# Patient Record
Sex: Female | Born: 1965
Health system: Southern US, Community
[De-identification: ages and names within clinical notes are randomized; demographics above are authoritative.]

## PROBLEM LIST (undated history)

## (undated) DIAGNOSIS — D72819 Decreased white blood cell count, unspecified: Secondary | ICD-10-CM

## (undated) DIAGNOSIS — L821 Other seborrheic keratosis: Secondary | ICD-10-CM

## (undated) DIAGNOSIS — G43909 Migraine, unspecified, not intractable, without status migrainosus: Secondary | ICD-10-CM

## (undated) DIAGNOSIS — U071 COVID-19: Secondary | ICD-10-CM

## (undated) DIAGNOSIS — E538 Deficiency of other specified B group vitamins: Secondary | ICD-10-CM

## (undated) HISTORY — DX: Migraine, unspecified, not intractable, without status migrainosus: G43.909

## (undated) HISTORY — DX: Deficiency of other specified B group vitamins: E53.8

## (undated) HISTORY — PX: NO PAST SURGERIES: SHX2092

## (undated) HISTORY — DX: Other seborrheic keratosis: L82.1

## (undated) HISTORY — DX: COVID-19: U07.1

## (undated) HISTORY — DX: Decreased white blood cell count, unspecified: D72.819

---

## 2012-05-23 HISTORY — PX: BREAST BIOPSY: SHX20

## 2017-02-27 ENCOUNTER — Encounter: Payer: Self-pay | Admitting: Medical

## 2017-02-27 ENCOUNTER — Ambulatory Visit: Payer: Self-pay | Admitting: Medical

## 2017-02-27 VITALS — BP 100/78 | HR 56 | Temp 97.7°F | Resp 18 | Ht 65.0 in | Wt 132.0 lb

## 2017-02-27 DIAGNOSIS — R21 Rash and other nonspecific skin eruption: Secondary | ICD-10-CM

## 2017-02-27 DIAGNOSIS — G43909 Migraine, unspecified, not intractable, without status migrainosus: Secondary | ICD-10-CM | POA: Insufficient documentation

## 2017-02-27 NOTE — Progress Notes (Signed)
   Subjective:    Patient ID: Veronica Henderson, female    DOB: 19-Dec-1965, 51 y.o.   MRN: [id number]  HPI 51 yo female with rash on lower legs x 1 week, itchy. Did mow the laaw one week ago.   Review of Systems  Constitutional: Negative for chills and fever.  HENT: Negative for congestion, ear pain and sore throat.   Eyes: Negative for discharge and itching.  Respiratory: Negative for cough and shortness of breath.   Cardiovascular: Negative for chest pain and leg swelling.  Gastrointestinal: Negative for abdominal pain.  Endocrine: Negative for polydipsia, polyphagia and polyuria.  Genitourinary: Negative for dysuria.  Musculoskeletal: Negative for myalgias.  Skin: Positive for rash.  Allergic/Immunologic: Negative for environmental allergies and food allergies.  Neurological: Negative for dizziness, syncope and light-headedness.  Hematological: Negative for adenopathy.  Psychiatric/Behavioral: Negative for self-injury and suicidal ideas.       Objective:   Physical Exam  Constitutional: She is oriented to person, place, and time. She appears well-developed and well-nourished.  HENT:  Head: Normocephalic and atraumatic.  Eyes: Pupils are equal, round, and reactive to light. Conjunctivae and EOM are normal.  Neurological: She is alert and oriented to person, place, and time.  Skin: Skin is warm and dry. Rash noted.  Psychiatric: She has a normal mood and affect. Her behavior is normal. Judgment and thought content normal.  Nursing note and vitals reviewed.   Few maculopapular rash on lower legs, anterior and posterior of legs, some excoriated. No discharge or drainage from rash. Patient says some turn to pustules none noted today.      Assessment & Plan:  Rash ? Contact dermatitis Will try conservative treatment with OTC hydrocortisone cream 1% twice daily to affected area x  7-10 days. OTC Zyrtec take as directed for itching. Return in  7-10 days unless worsening , return  sooner. Patient verbalizes understanding and has no questions at discharge.

## 2017-02-27 NOTE — Patient Instructions (Signed)
OTC hydrocortisone cream 1% twice day for up to  10 days. OTC Zyrtec for itching take one daily. Return in  7-10 sooner if worsening.   Rash A rash is a change in the color of the skin. A rash can also change the way your skin feels. There are many different conditions and factors that can cause a rash. Follow these instructions at home: Pay attention to any changes in your symptoms. Follow these instructions to help with your condition: Medicine Take or apply over-the-counter and prescription medicines only as told by your health care provider. These may include:  Corticosteroid cream.  Anti-itch lotions.  Oral antihistamines.  Skin Care  Apply cool compresses to the affected areas.  Try taking a bath with: ? Epsom salts. Follow the instructions on the packaging. You can get these at your local pharmacy or grocery store. ? Baking soda. Pour a small amount into the bath as told by your health care provider. ? Colloidal oatmeal. Follow the instructions on the packaging. You can get this at your local pharmacy or grocery store.  Try applying baking soda paste to your skin. Stir water into baking soda until it reaches a paste-like consistency.  Do not scratch or rub your skin.  Avoid covering the rash. Make sure the rash is exposed to air as much as possible. General instructions  Avoid hot showers or baths, which can make itching worse. A cold shower may help.  Avoid scented soaps, detergents, and perfumes. Use gentle soaps, detergents, perfumes, and other cosmetic products.  Avoid any substance that causes your rash. Keep a journal to help track what causes your rash. Write down: ? What you eat. ? What cosmetic products you use. ? What you drink. ? What you wear. This includes jewelry.  Keep all follow-up visits as told by your health care provider. This is important. Contact a health care provider if:  You sweat at night.  You lose weight.  You urinate more than  normal.  You feel weak.  You vomit.  Your skin or the whites of your eyes look yellow (jaundice).  Your skin: ? Tingles. ? Is numb.  Your rash: ? Does not go away after several days. ? Gets worse.  You are: ? Unusually thirsty. ? More tired than normal.  You have: ? New symptoms. ? Pain in your abdomen. ? A fever. ? Diarrhea. Get help right away if:  You develop a rash that covers all or most of your body. The rash may or may not be painful.  You develop blisters that: ? Are on top of the rash. ? Grow larger or grow together. ? Are painful. ? Are inside your nose or mouth.  You develop a rash that: ? Looks like purple pinprick-sized spots all over your body. ? Has a "bull's eye" or looks like a target. ? Is not related to sun exposure, is red and painful, and causes your skin to peel. This information is not intended to replace advice given to you by your health care provider. Make sure you discuss any questions you have with your health care provider. Document Released: 04/29/2002 Document Revised: 10/13/2015 Document Reviewed: 09/24/2014 Elsevier Interactive Patient Education  2017 [employer].

## 2017-11-21 DIAGNOSIS — L247 Irritant contact dermatitis due to plants, except food: Secondary | ICD-10-CM | POA: Diagnosis not present

## 2018-06-20 ENCOUNTER — Encounter: Payer: Self-pay | Admitting: Internal Medicine

## 2018-06-20 ENCOUNTER — Ambulatory Visit: Payer: BLUE CROSS/BLUE SHIELD | Admitting: Internal Medicine

## 2018-06-20 ENCOUNTER — Telehealth: Payer: Self-pay

## 2018-06-20 ENCOUNTER — Encounter

## 2018-06-20 VITALS — BP 106/63 | HR 69 | Temp 97.8°F | Ht 65.0 in | Wt 137.2 lb

## 2018-06-20 DIAGNOSIS — Z1211 Encounter for screening for malignant neoplasm of colon: Secondary | ICD-10-CM

## 2018-06-20 DIAGNOSIS — Z1159 Encounter for screening for other viral diseases: Secondary | ICD-10-CM

## 2018-06-20 DIAGNOSIS — G43909 Migraine, unspecified, not intractable, without status migrainosus: Secondary | ICD-10-CM

## 2018-06-20 DIAGNOSIS — Z1389 Encounter for screening for other disorder: Secondary | ICD-10-CM

## 2018-06-20 DIAGNOSIS — Z1329 Encounter for screening for other suspected endocrine disorder: Secondary | ICD-10-CM

## 2018-06-20 DIAGNOSIS — E559 Vitamin D deficiency, unspecified: Secondary | ICD-10-CM

## 2018-06-20 DIAGNOSIS — Z0000 Encounter for general adult medical examination without abnormal findings: Secondary | ICD-10-CM | POA: Diagnosis not present

## 2018-06-20 DIAGNOSIS — Z1231 Encounter for screening mammogram for malignant neoplasm of breast: Secondary | ICD-10-CM | POA: Diagnosis not present

## 2018-06-20 DIAGNOSIS — Z13220 Encounter for screening for lipoid disorders: Secondary | ICD-10-CM

## 2018-06-20 MED ORDER — SUMATRIPTAN SUCCINATE 50 MG PO TABS: ORAL_TABLET | ORAL | 2 refills | Status: DC

## 2018-06-20 NOTE — Progress Notes (Signed)
Chief Complaint  Patient presents with  . Establish Care   New patient  1. Chronic migraines since kid lasting x 3 days using 18 imitrex daily otc meds do not help. She feels increased anxiety before h/as mostly they are on right eye and cheek?>left assoc. N/v not bothered by light sound a times can bother. Eye MD per pt eyes ok. Drinking 2-3 cups coffee ~10 oz each per day FH older sister and mGM migraines which stopped at older age sister in 66s. Sleeping 7-8 hrs at night    Review of Systems  Constitutional: Negative for weight loss.  HENT: Negative for hearing loss.   Eyes: Negative for blurred vision.  Respiratory: Negative for shortness of breath and wheezing.   Cardiovascular: Negative for chest pain.  Gastrointestinal: Negative for abdominal pain, nausea and vomiting.  Musculoskeletal: Negative for falls.  Skin: Negative for rash.  Neurological: Positive for headaches.  Psychiatric/Behavioral: Negative for depression.   Past Medical History:  Diagnosis Date  . Migraines    since childhood   Past Surgical History:  Procedure Laterality Date  . NO PAST SURGERIES     Family History  Problem Relation Age of Onset  . Heart disease Mother   . Heart disease Father        mi  . Hyperlipidemia Father   . Hypertension Father   . Cancer Sister        triple neg breast  . Stroke Maternal Grandmother   . Cancer Sister        cervical age 66 y.o    Social History   Socioeconomic History  . Marital status: Married    Spouse name: Not on file  . Number of children: Not on file  . Years of education: Not on file  . Highest education level: Not on file  Occupational History  . Not on file  Social Needs  . Financial resource strain: Not on file  . Food insecurity:    Worry: Not on file    Inability: Not on file  . Transportation needs:    Medical: Not on file    Non-medical: Not on file  Tobacco Use  . Smoking status: Never Smoker  . Smokeless tobacco: Never Used   Substance and Sexual Activity  . Alcohol use: Yes    Alcohol/week: 5.0 standard drinks    Types: 5 Cans of beer per week  . Drug use: No  . Sexual activity: Not on file  Lifestyle  . Physical activity:    Days per week: Not on file    Minutes per session: Not on file  . Stress: Not on file  Relationships  . Social connections:    Talks on phone: Not on file    Gets together: Not on file    Attends religious service: Not on file    Active member of club or organization: Not on file    Attends meetings of clubs or organizations: Not on file    Relationship status: Not on file  . Intimate partner violence:    Fear of current or ex partner: Not on file    Emotionally abused: Not on file    Physically abused: Not on file    Forced sexual activity: Not on file  Other Topics Concern  . Not on file  Social History Narrative   Married wife Tanja Port from [place] in 2018    Never smoker    No guns, wears seat belt,  safe in relationship    Current Meds  Medication Sig  . SUMAtriptan (IMITREX) 50 MG tablet TAKE ONE TABLET BY MOUTH FOR HEADACHE prn , CAN REPEAT IN 2 HOURS if needed max 200 mg daily  . [DISCONTINUED] SUMAtriptan (IMITREX) 50 MG tablet TAKE ONE TABLET BY MOUTH FOR HEADACHE CAN REPEAT IN 2 HOURS   No Known Allergies No results found for this or any previous visit (from the past 2160 hour(s)). Objective  Body mass index is 22.83 kg/m. Wt Readings from Last 3 Encounters:  06/20/18 137 lb 3.2 oz (62.2 kg)  02/27/17 132 lb (59.9 kg)   Temp Readings from Last 3 Encounters:  06/20/18 97.8 F (36.6 C) (Oral)  02/27/17 97.7 F (36.5 C) (Tympanic)   BP Readings from Last 3 Encounters:  06/20/18 106/63  02/27/17 100/78   Pulse Readings from Last 3 Encounters:  06/20/18 69  02/27/17 (!) 56    Physical Exam Vitals signs and nursing note reviewed.  Constitutional:      Appearance: Normal appearance. She is well-developed and well-groomed.   HENT:     Head: Normocephalic and atraumatic.     Nose: Nose normal.     Mouth/Throat:     Mouth: Mucous membranes are moist.     Pharynx: Oropharynx is clear.  Eyes:     Conjunctiva/sclera: Conjunctivae normal.     Pupils: Pupils are equal, round, and reactive to light.  Cardiovascular:     Rate and Rhythm: Normal rate and regular rhythm.     Pulses: Normal pulses.     Heart sounds: Normal heart sounds. No murmur.  Pulmonary:     Effort: Pulmonary effort is normal.     Breath sounds: Normal breath sounds.  Skin:    General: Skin is warm and dry.  Neurological:     General: No focal deficit present.     Mental Status: She is alert and oriented to person, place, and time.     Gait: Gait normal.  Psychiatric:        Attention and Perception: Attention and perception normal.        Mood and Affect: Mood normal.        Speech: Speech normal.        Behavior: Behavior normal. Behavior is cooperative.        Thought Content: Thought content normal.        Cognition and Memory: Cognition and memory normal.        Judgment: Judgment normal.     Assessment   1. Chronic migraines  2. HM Plan   1. Referred neurology  Refilled imitrex  Eye md says eyes ok  2.  sch fasting labs   Declines flu shot  Tdap had 01/2019  Check hep B/MMR Mammogram referred  Colonoscopy referred  Pap at f/u  Dermatology no need for now eczema right forearm using vasoline for now   Dentist Dr. Eugenie Birks Eye Dr. Gloriann Loan  Provider: Dr. Olivia Mackie McLean-Scocuz[facility]

## 2018-06-20 NOTE — Progress Notes (Signed)
Pre visit review using our clinic review tool, if applicable. No additional management support is needed unless otherwise documented below in the visit note. 

## 2018-06-20 NOTE — Patient Instructions (Addendum)
Magnesium 400-500 mg daily  Vitamin B2 200 2x per day   cetaphil or cerave cream  Dove white/unscented soap  Jason liquid soap    Eczema Eczema is a broad term for a group of skin conditions that cause skin to become rough and inflamed. Each type of eczema has different triggers, symptoms, and treatments. Eczema of any type is usually itchy and symptoms range from mild to severe. Eczema and its symptoms are not spread from person to person (are not contagious). It can appear on different parts of the body at different times. Your eczema may not look the same as someone else's eczema. What are the types of eczema? Atopic dermatitis This is a long-term (chronic) skin disease that keeps coming back (recurring). Usual symptoms are dry skin and small, solid pimples that may swell and leak fluid (weep). Contact dermatitis  This happens when something irritates the skin and causes a rash. The irritation can come from substances that you are allergic to (allergens), such as poison ivy, chemicals, or medicines that were applied to your skin. Dyshidrotic eczema This is a form of eczema on the hands and feet. It shows up as very itchy, fluid-filled blisters. It can affect people of any age, but is more common before age 67. Hand eczema  This causes very itchy areas of skin on the palms and sides of the hands and fingers. This type of eczema is common in industrial jobs where you may be exposed to many different types of irritants. Lichen simplex chronicus This type of eczema occurs when a person constantly scratches one area of the body. Repeated scratching of the area leads to thickened skin (lichenification). Lichen simplex chronicus can occur along with other types of eczema. It is more common in adults, but may be seen in children as well. Nummular eczema This is a common type of eczema. It has no known cause. It typically causes a red, circular, crusty lesion (plaque) that may be itchy. Scratching  may become a habit and can cause bleeding. Nummular eczema occurs most often in people of middle-age or older. It most often affects the hands. Seborrheic dermatitis This is a common skin disease that mainly affects the scalp. It may also affect any oily areas of the body, such as the face, sides of nose, eyebrows, ears, eyelids, and chest. It is marked by small scaling and redness of the skin (erythema). This can affect people of all ages. In infants, this condition is known as Chartered certified accountant." Stasis dermatitis This is a common skin disease that usually appears on the legs and feet. It most often occurs in people who have a condition that prevents blood from being pumped through the veins in the legs (chronic venous insufficiency). Stasis dermatitis is a chronic condition that needs long-term management. How is eczema diagnosed? Your health care provider will examine your skin and review your medical history. He or she may also give you skin patch tests. These tests involve taking patches that contain possible allergens and placing them on your back. He or she will then check in a few days to see if an allergic reaction occurred. What are the common treatments? Treatment for eczema is based on the type of eczema you have. Hydrocortisone steroid medicine can relieve itching quickly and help reduce inflammation. This medicine may be prescribed or obtained over-the-counter, depending on the strength of the medicine that is needed. Follow these instructions at home:  Take over-the-counter and prescription medicines only as told by  your health care provider.  Use creams or ointments to moisturize your skin. Do not use lotions.  Learn what triggers or irritates your symptoms. Avoid these things.  Treat symptom flare-ups quickly.  Do not itch your skin. This can make your rash worse.  Keep all follow-up visits as told by your health care provider. This is important. Where to find more  information  The American Academy of Dermatology: http://jones-macias.info/  The National Eczema Association: www.nationaleczema.org Contact a health care provider if:  You have serious itching, even with treatment.  You regularly scratch your skin until it bleeds.  Your rash looks different than usual.  Your skin is painful, swollen, or more red than usual.  You have a fever. Summary  There are eight general types of eczema. Each type has different triggers.  Eczema of any type causes itching that may range from mild to severe.  Treatment varies based on the type of eczema you have. Hydrocortisone steroid medicine can help with itching and inflammation.  Protecting your skin is the best way to prevent eczema. Use moisturizers and lotions. Avoid triggers and irritants, and treat flare-ups quickly. This information is not intended to replace advice given to you by your health care provider. Make sure you discuss any questions you have with your health care provider. Document Released: 09/22/2016 Document Revised: 09/22/2016 Document Reviewed: 09/22/2016 Elsevier Interactive Patient Education  2019 [employer].    Migraine Headache A migraine headache is an intense, throbbing pain on one side or both sides of the head. Migraines may also cause other symptoms, such as nausea, vomiting, and sensitivity to light and noise. What are the causes? Doing or taking certain things may also trigger migraines, such as:  Alcohol.  Smoking.  Medicines, such as: ? Medicine used to treat chest pain (nitroglycerine). ? Birth control pills. ? Estrogen pills. ? Certain blood pressure medicines.  Aged cheeses, chocolate, or caffeine.  Foods or drinks that contain nitrates, glutamate, aspartame, or tyramine.  Physical activity. Other things that may trigger a migraine include:  Menstruation.  Pregnancy.  Hunger.  Stress, lack of sleep, too much sleep, or fatigue.  Weather changes. What  increases the risk? The following factors may make you more likely to experience migraine headaches:  Age. Risk increases with age.  Family history of migraine headaches.  Being Caucasian.  Depression and anxiety.  Obesity.  Being a woman.  Having a hole in the heart (patent foramen ovale) or other heart problems. What are the signs or symptoms? The main symptom of this condition is pulsating or throbbing pain. Pain may:  Happen in any area of the head, such as on one side or both sides.  Interfere with daily activities.  Get worse with physical activity.  Get worse with exposure to bright lights or loud noises. Other symptoms may include:  Nausea.  Vomiting.  Dizziness.  General sensitivity to bright lights, loud noises, or smells. Before you get a migraine, you may get warning signs that a migraine is developing (aura). An aura may include:  Seeing flashing lights or having blind spots.  Seeing bright spots, halos, or zigzag lines.  Having tunnel vision or blurred vision.  Having numbness or a tingling feeling.  Having trouble talking.  Having muscle weakness. How is this diagnosed? A migraine headache can be diagnosed based on:  Your symptoms.  A physical exam.  Tests, such as CT scan or MRI of the head. These imaging tests can help rule out other causes  of headaches.  Taking fluid from the spine (lumbar puncture) and analyzing it (cerebrospinal fluid analysis, or CSF analysis). How is this treated? A migraine headache is usually treated with medicines that:  Relieve pain.  Relieve nausea.  Prevent migraines from coming back. Treatment may also include:  Acupuncture.  Lifestyle changes like avoiding foods that trigger migraines. Follow these instructions at home: Medicines  Take over-the-counter and prescription medicines only as told by your health care provider.  Do not drive or use heavy machinery while taking prescription pain  medicine.  To prevent or treat constipation while you are taking prescription pain medicine, your health care provider may recommend that you: ? Drink enough fluid to keep your urine clear or pale yellow. ? Take over-the-counter or prescription medicines. ? Eat foods that are high in fiber, such as fresh fruits and vegetables, whole grains, and beans. ? Limit foods that are high in fat and processed sugars, such as fried and sweet foods. Lifestyle  Avoid alcohol use.  Do not use any products that contain nicotine or tobacco, such as cigarettes and e-cigarettes. If you need help quitting, ask your health care provider.  Get at least 8 hours of sleep every night.  Limit your stress. General instructions      Keep a journal to find out what may trigger your migraine headaches. For example, write down: ? What you eat and drink. ? How much sleep you get. ? Any change to your diet or medicines.  If you have a migraine: ? Avoid things that make your symptoms worse, such as bright lights. ? It may help to lie down in a dark, quiet room. ? Do not drive or use heavy machinery. ? Ask your health care provider what activities are safe for you while you are experiencing symptoms.  Keep all follow-up visits as told by your health care provider. This is important. Contact a health care provider if:  You develop symptoms that are different or more severe than your usual migraine symptoms. Get help right away if:  Your migraine becomes severe.  You have a fever.  You have a stiff neck.  You have vision loss.  Your muscles feel weak or like you cannot control them.  You start to lose your balance often.  You develop trouble walking.  You faint. This information is not intended to replace advice given to you by your health care provider. Make sure you discuss any questions you have with your health care provider. Document Released: 05/09/2005 Document Revised: 11/27/2015 Document  Reviewed: 10/26/2015 Elsevier Interactive Patient Education  2019 [employer].

## 2018-06-20 NOTE — Telephone Encounter (Signed)
Pt missed a call to schedule a colonoscopy

## 2018-06-21 ENCOUNTER — Other Ambulatory Visit (INDEPENDENT_AMBULATORY_CARE_PROVIDER_SITE_OTHER): Payer: BLUE CROSS/BLUE SHIELD

## 2018-06-21 DIAGNOSIS — Z13220 Encounter for screening for lipoid disorders: Secondary | ICD-10-CM | POA: Diagnosis not present

## 2018-06-21 DIAGNOSIS — Z0000 Encounter for general adult medical examination without abnormal findings: Secondary | ICD-10-CM

## 2018-06-21 DIAGNOSIS — Z1389 Encounter for screening for other disorder: Secondary | ICD-10-CM

## 2018-06-21 DIAGNOSIS — Z1159 Encounter for screening for other viral diseases: Secondary | ICD-10-CM | POA: Diagnosis not present

## 2018-06-21 DIAGNOSIS — E559 Vitamin D deficiency, unspecified: Secondary | ICD-10-CM

## 2018-06-21 DIAGNOSIS — Z1329 Encounter for screening for other suspected endocrine disorder: Secondary | ICD-10-CM

## 2018-06-21 LAB — COMPREHENSIVE METABOLIC PANEL
ALT: 20 U/L (ref 0–35)
AST: 27 U/L (ref 0–37)
Albumin: 4.3 g/dL (ref 3.5–5.2)
Alkaline Phosphatase: 85 U/L (ref 39–117)
BUN: 9 mg/dL (ref 6–23)
CO2: 30 meq/L (ref 19–32)
Calcium: 9.5 mg/dL (ref 8.4–10.5)
Chloride: 103 mEq/L (ref 96–112)
Creatinine, Ser: 0.58 mg/dL (ref 0.40–1.20)
GFR: 108.9 mL/min (ref >60.00)
GLUCOSE: 88 mg/dL (ref 70–99)
Potassium: 3.9 mEq/L (ref 3.5–5.1)
Sodium: 139 mEq/L (ref 135–145)
Total Bilirubin: 0.4 mg/dL (ref 0.2–1.2)
Total Protein: 6.9 g/dL (ref 6.0–8.3)

## 2018-06-21 LAB — LIPID PANEL
Cholesterol: 186 mg/dL (ref 0–200)
HDL: 67.5 mg/dL (ref >39.00)
LDL Cholesterol: 107 mg/dL — ABNORMAL HIGH (ref 0–99)
NonHDL: 118.71
Total CHOL/HDL Ratio: 3
Triglycerides: 59 mg/dL (ref 0.0–149.0)
VLDL: 11.8 mg/dL (ref 0.0–40.0)

## 2018-06-21 LAB — CBC WITH DIFFERENTIAL/PLATELET
BASOS ABS: 0 10*3/uL (ref 0.0–0.1)
Basophils Relative: 0.8 % (ref 0.0–3.0)
Eosinophils Absolute: 0 10*3/uL (ref 0.0–0.7)
Eosinophils Relative: 0.6 % (ref 0.0–5.0)
HCT: 41.7 % (ref 36.0–46.0)
Hemoglobin: 14.1 g/dL (ref 12.0–15.0)
Lymphocytes Relative: 30.2 % (ref 12.0–46.0)
Lymphs Abs: 1.1 10*3/uL (ref 0.7–4.0)
MCHC: 33.9 g/dL (ref 30.0–36.0)
MCV: 96.9 fl (ref 78.0–100.0)
Monocytes Absolute: 0.5 10*3/uL (ref 0.1–1.0)
Monocytes Relative: 12.6 % — ABNORMAL HIGH (ref 3.0–12.0)
Neutro Abs: 2.1 10*3/uL (ref 1.4–7.7)
Neutrophils Relative %: 55.8 % (ref 43.0–77.0)
PLATELETS: 289 10*3/uL (ref 150.0–400.0)
RBC: 4.31 Mil/uL (ref 3.87–5.11)
RDW: 13.7 % (ref 11.5–15.5)
WBC: 3.7 10*3/uL — ABNORMAL LOW (ref 4.0–10.5)

## 2018-06-21 LAB — TSH: TSH: 2.88 u[IU]/mL (ref 0.35–4.50)

## 2018-06-21 LAB — VITAMIN D 25 HYDROXY (VIT D DEFICIENCY, FRACTURES): VITD: 36.61 ng/mL (ref 30.00–100.00)

## 2018-06-21 LAB — T4, FREE: Free T4: 0.92 ng/dL (ref 0.60–1.60)

## 2018-06-21 NOTE — Telephone Encounter (Signed)
Returned patients call to schedule her colonoscopy.  LVM for her to call back.  Thanks [employer]

## 2018-06-22 LAB — URINALYSIS, ROUTINE W REFLEX MICROSCOPIC
Bilirubin Urine: NEGATIVE
Glucose, UA: NEGATIVE
Hgb urine dipstick: NEGATIVE
Ketones, ur: NEGATIVE
Leukocytes, UA: NEGATIVE
Nitrite: NEGATIVE
Protein, ur: NEGATIVE
SPECIFIC GRAVITY, URINE: 1.014 (ref 1.001–1.03)
pH: >=8.5 — AB (ref 5.0–8.0)

## 2018-06-22 LAB — MEASLES/MUMPS/RUBELLA IMMUNITY
Mumps IgG: <9 AU/mL — ABNORMAL LOW
Rubella: 1.47 index
Rubeola IgG: <13.5 AU/mL — ABNORMAL LOW

## 2018-06-22 LAB — HEPATITIS B SURFACE ANTIBODY, QUANTITATIVE: Hep B S AB Quant (Post): <5 m[IU]/mL — ABNORMAL LOW (ref >=10)

## 2018-06-25 ENCOUNTER — Encounter: Payer: Self-pay | Admitting: *Deleted

## 2018-06-26 ENCOUNTER — Encounter: Payer: Self-pay | Admitting: Internal Medicine

## 2018-07-02 ENCOUNTER — Encounter: Payer: Self-pay | Admitting: *Deleted

## 2018-07-05 ENCOUNTER — Telehealth: Payer: Self-pay

## 2018-07-05 NOTE — Telephone Encounter (Signed)
Copied from [place] [id number]. Topic: General - Inquiry >> Jul 05, 2018 11:17 AM Alanda Slim E wrote: Reason for CRM: [facility] called and stated that they keep receiving request for PAP and [facility] images and lab reports. They stated that they dont do PAPs and that would have to be done with PCP and they have already fedex'd the images and reports on 2.03.2020 / wanted to confirm if images and reports were received./ please advise

## 2018-07-11 ENCOUNTER — Telehealth: Payer: Self-pay | Admitting: Gastroenterology

## 2018-07-11 ENCOUNTER — Other Ambulatory Visit: Payer: Self-pay

## 2018-07-11 DIAGNOSIS — Z1211 Encounter for screening for malignant neoplasm of colon: Secondary | ICD-10-CM

## 2018-07-11 NOTE — Telephone Encounter (Signed)
Pt is calling to schedule a colonoscopy  °

## 2018-07-11 NOTE — Telephone Encounter (Signed)
LVM for pt to call office back in regards to scheduling her colonoscopy.  Thanks [employer]

## 2018-07-11 NOTE — Telephone Encounter (Signed)
Pt returned Michelle's call to schedule a colonoscopy.

## 2018-07-11 NOTE — Telephone Encounter (Signed)
Call returned.  Colonoscopy scheduled for 08/01/18 at [facility] with Dr. Bonna Gains.  Thanks [employer]

## 2018-07-17 ENCOUNTER — Ambulatory Visit
Admission: RE | Admit: 2018-07-17 | Discharge: 2018-07-17 | Disposition: A | Discharge status: Home / Self Care | Payer: BLUE CROSS/BLUE SHIELD | Source: Ambulatory Visit | Attending: Internal Medicine | Admitting: Internal Medicine

## 2018-07-17 ENCOUNTER — Other Ambulatory Visit: Payer: Self-pay | Admitting: Internal Medicine

## 2018-07-17 DIAGNOSIS — Z1231 Encounter for screening mammogram for malignant neoplasm of breast: Secondary | ICD-10-CM | POA: Insufficient documentation

## 2018-07-17 DIAGNOSIS — R928 Other abnormal and inconclusive findings on diagnostic imaging of breast: Secondary | ICD-10-CM

## 2018-07-18 ENCOUNTER — Ambulatory Visit: Payer: BLUE CROSS/BLUE SHIELD | Admitting: Internal Medicine

## 2018-07-18 ENCOUNTER — Encounter: Payer: Self-pay | Admitting: Internal Medicine

## 2018-07-18 ENCOUNTER — Other Ambulatory Visit (HOSPITAL_COMMUNITY)
Admission: RE | Admit: 2018-07-18 | Discharge: 2018-07-18 | Disposition: A | Discharge status: Home / Self Care | Payer: BLUE CROSS/BLUE SHIELD | Source: Ambulatory Visit | Attending: Internal Medicine | Admitting: Internal Medicine

## 2018-07-18 VITALS — BP 98/58 | HR 56 | Temp 97.8°F | Ht 65.0 in | Wt 139.4 lb

## 2018-07-18 DIAGNOSIS — D72819 Decreased white blood cell count, unspecified: Secondary | ICD-10-CM | POA: Diagnosis not present

## 2018-07-18 DIAGNOSIS — Z124 Encounter for screening for malignant neoplasm of cervix: Secondary | ICD-10-CM | POA: Insufficient documentation

## 2018-07-18 DIAGNOSIS — Z0000 Encounter for general adult medical examination without abnormal findings: Secondary | ICD-10-CM | POA: Insufficient documentation

## 2018-07-18 DIAGNOSIS — G43919 Migraine, unspecified, intractable, without status migrainosus: Secondary | ICD-10-CM

## 2018-07-18 LAB — CBC WITH DIFFERENTIAL/PLATELET
BASOS ABS: 0 10*3/uL (ref 0.0–0.1)
Basophils Relative: 0.7 % (ref 0.0–3.0)
Eosinophils Absolute: 0 10*3/uL (ref 0.0–0.7)
Eosinophils Relative: 0.5 % (ref 0.0–5.0)
HCT: 40.5 % (ref 36.0–46.0)
Hemoglobin: 13.8 g/dL (ref 12.0–15.0)
Lymphocytes Relative: 35.2 % (ref 12.0–46.0)
Lymphs Abs: 1.2 10*3/uL (ref 0.7–4.0)
MCHC: 34 g/dL (ref 30.0–36.0)
MCV: 97.2 fl (ref 78.0–100.0)
Monocytes Absolute: 0.5 10*3/uL (ref 0.1–1.0)
Monocytes Relative: 15.1 % — ABNORMAL HIGH (ref 3.0–12.0)
NEUTROS ABS: 1.7 10*3/uL (ref 1.4–7.7)
Neutrophils Relative %: 48.5 % (ref 43.0–77.0)
Platelets: 313 10*3/uL (ref 150.0–400.0)
RBC: 4.17 Mil/uL (ref 3.87–5.11)
RDW: 13.4 % (ref 11.5–15.5)
WBC: 3.5 10*3/uL — ABNORMAL LOW (ref 4.0–10.5)

## 2018-07-18 NOTE — Patient Instructions (Signed)
Abdominal Aortic Aneurysm  An aneurysm is a bulge in an artery. It happens when blood pushes against a weakened or damaged artery wall. An abdominal aortic aneurysm (AAA) is an aneurysm that occurs in the lower part of the aorta, which is the main artery of the body. The aorta supplies blood from the heart to the rest of the body. Some aneurysms may not cause symptoms. However, an AAA can cause two serious problems:  It can enlarge and burst (rupture).  It can cause blood to flow between the layers of the wall of the aorta through a tear (aortic dissection). Both of these problems are medical emergencies. They can cause bleeding inside the body. If they are not diagnosed and treated right away, they can be life-threatening. What are the causes? The exact cause of this condition is not known. What increases the risk? The following factors may make you more likely to develop this condition:  Being a female 53 years of age or older.  Being Caucasian.  Using tobacco or having a history of tobacco use.  Having a family history of aneurysms.  Having any of the following conditions: ? Hardening of the arteries (arteriosclerosis). ? Inflammation of the walls of an artery (arteritis). ? Certain genetic conditions. ? Obesity. ? An infection in the wall of the aorta (infectious aortitis) caused by bacteria. ? High cholesterol. ? High blood pressure (hypertension). What are the signs or symptoms? Symptoms of this condition vary depending on the size of the aneurysm and how fast it is growing. Most aneurysms grow slowly and do not cause any symptoms. When symptoms do occur, they may include:  Pain in the abdomen, side, or lower back.  Feeling full after eating only small amounts of food.  Feeling a pulsating lump in the abdomen. Symptoms that the aneurysm has ruptured include:  A sudden onset of severe pain in the abdomen, side, or back.  Nausea or vomiting.  Feeling light-headed or  passing out. How is this diagnosed? This condition may be diagnosed with:  A physical exam to check for throbbing and to listen to blood flow in your abdomen.  Tests, such as: ? Ultrasound. ? X-rays. ? CT scan. ? MRI. ? Tests to check your arteries for damage or blockage (angiogram). Because most unruptured AAAs cause no symptoms, they are often found during exams for other conditions. How is this treated? Treatment for this condition depends on:  The size of the aneurysm.  How fast the aneurysm is growing.  Your age.  Risk factors for rupture. If your aneurysm is smaller than 2 inches (5 cm), your health care provider may manage it by:  Checking (monitoring) it regularly to see if it is getting bigger. Depending on the size of the aneurysm, how fast it is growing, and your other risk factors, you may have an ultrasound to monitor it every 3-6 months, every year, or every few years.  Giving you medicines to control blood pressure, treat pain, or fight infection. If your aneurysm is larger than 2 inches (5 cm), your health care provider may do surgery to repair it. Follow these instructions at home: Lifestyle  Do not use any products that contain nicotine or tobacco, such as cigarettes, e-cigarettes, and chewing tobacco. If you need help quitting, ask your health care provider.  Stay physically active and exercise regularly. Talk with your health care provider about how often you should exercise and which types of exercise are safe for you. Eating and drinking    Eat a heart-healthy diet. This includes plenty of fresh fruits and vegetables, whole grains, low-fat (lean) protein, and low-fat dairy products.  Avoid foods that are high in saturated fat and cholesterol, such as red meat and some dairy products.  Do not drink alcohol if: ? Your health care provider tells you not to drink. ? You are pregnant, may be pregnant, or are planning to become pregnant.  If you drink  alcohol: ? Limit how much you use to:  0-1 drink a day for women.  0-2 drinks a day for men. ? Be aware of how much alcohol is in your drink. In the [place] one drink equals one typical bottle of beer (12 oz), one-half glass of wine (5 oz), or one shot of hard liquor (1 oz). General instructions  Take over-the-counter and prescription medicines only as told by your health care provider.  Keep your blood pressure within a normal range. Check it regularly, and ask your health care provider what your target blood pressure should be.  Have your blood sugar (glucose) level and cholesterol levels checked regularly. Follow your health care provider's instructions on how to keep levels within normal limits.  Avoid heavy lifting and activities that take a lot of effort (are strenuous). Ask your health care provider what activities are safe for you.  Keep all follow-up visits as told by your health care provider. This is important. Contact a health care provider if you:  Have pain in your abdomen, side, or back.  Have a throbbing feeling in your abdomen. Get help right away if you:  Have sudden, severe pain in your abdomen, side, or back.  Experience nausea or vomiting.  Have constipation or problems urinating.  Feel light-headed.  Have a rapid heart rate when you stand.  Have sweaty, clammy skin.  Have shortness of breath.  Have a fever. These symptoms may represent a serious problem that is an emergency. Do not wait to see if the symptoms will go away. Get medical help right away. Call your local emergency services (911 in the [place] Do not drive yourself to the hospital. Summary  An aneurysm is a bulge in an artery. It happens when blood pushes against a weakened or damaged artery wall.  Being older, female, Caucasian, having a history of tobacco use, and a family history of aneurysms can increase the risk.  These problems can cause bleeding inside the body and can be  life-threatening. Get medical help right away. This information is not intended to replace advice given to you by your health care provider. Make sure you discuss any questions you have with your health care provider. Document Released: 02/16/2005 Document Revised: 12/16/2017 Document Reviewed: 12/16/2017 Elsevier Interactive Patient Education  2019 [employer]  Leukopenia Leukopenia is a condition in which you have a low number of white blood cells. White blood cells help the body to fight infections. The number of white blood cells in the body varies from person to person. There are five types of white blood cells. Two types (lymphocytes and neutrophils) make up most of the white blood cell count. When lymphocytes are low, the condition is called lymphocytopenia. When neutrophils are low, it is called neutropenia. Neutropenia is the most dangerous type of leukopenia because it can lead to dangerous infections. What are the causes? This condition is commonly caused by damage to soft tissue inside of the bones (bone marrow), which is where most white blood cells are made. Bone marrow can get damaged by:  Medicine or X-ray treatments for cancer (chemotherapy or radiation therapy).  Serious infections.  Cancer of the white blood cells (leukemia, lymphoma, or myeloma).  Medicines, including: ? Certain antibiotics. ? Certain heart medicines. ? Steroids. ? Certain medicines used to treat diseases of the immune system (autoimmune diseases), like rheumatoid arthritis. Leukopenia also happens when white blood cells are destroyed after leaving the bone marrow, which may result from:  Liver disease.  Autoimmune disease.  Vitamin B deficiencies. What are the signs or symptoms? One of the most common signs of leukopenia, especially severe neutropenia, is having a lot of bacterial infections. Different infections have different symptoms. An infection in your lungs may cause coughing. A urinary  tract infection may cause frequent urination and a burning sensation. You may also get infections of the blood, skin, rectum, throat, sinuses, or ears. Some people have no symptoms. If you do have symptoms, they may include:  Fever.  Fatigue.  Swollen glands (lymph nodes).  Painful mouth ulcers.  Gum disease. How is this diagnosed? This condition may be diagnosed based on:  Your medical history.  A physical exam to check for swollen lymph nodes and an enlarged spleen. Your spleen is an organ on the left side of your body that stores white blood cells.  Tests, such as: ? A complete blood count. This blood test counts each type of white cell. ? Bone marrow aspiration. Some bone marrow is removed to be checked under a microscope. ? Lymph node biopsy. Some lymph node tissue is removed to be checked under a microscope. ? Other types of blood tests or imaging tests. How is this treated? Treatment of leukopenia depends on the cause. Some common treatments include:  Antibiotic medicine to treat bacterial infections.  Stopping medicines that may cause leukopenia.  Medicines to stimulate neutrophil production (hematopoietic growth factors), to treat neutropenia. Follow these instructions at home:  Take over-the-counter and prescription medicines only as told by your health care provider. This includes supplements and vitamins.  If you were prescribed an antibiotic medicine, take it as told by your health care provider. Do not stop taking the antibiotic even if you start to feel better.  Preventing infection is important if you have leukopenia. To prevent infection: ? Avoid close contact with sick people. ? Wash your hands frequently with soap and water. If soap and water are not available, use hand sanitizer. ? Do not eat uncooked or undercooked meats. ? Wash fruits and vegetables before eating them. ? Do not eat or drink unpasteurized dairy products. ? Get regular dental care, and  maintain good dental hygiene. You should visit the dentist at least once every 6 months.  Keep all follow-up visits as told by your health care provider. This is important. Contact a health care provider if:  You have chills or a fever.  You have symptoms of an infection. Get help right away if:  You have a fever that lasts for more than 2-3 days.  You have symptoms that last for more than 2-3 days.  You have trouble breathing.  You have chest pain. This information is not intended to replace advice given to you by your health care provider. Make sure you discuss any questions you have with your health care provider. Document Released: 05/14/2013 Document Revised: 03/29/2016 Document Reviewed: 03/29/2016 Elsevier Interactive Patient Education  2019 [employer].

## 2018-07-18 NOTE — Progress Notes (Signed)
Pre visit review using our clinic review tool, if applicable. No additional management support is needed unless otherwise documented below in the visit note. 

## 2018-07-18 NOTE — Progress Notes (Addendum)
Chief Complaint  Patient presents with  . Follow-up   Annual  1. Migraines has prn imitrex appt neurology 09/03/2018 8 am  2. Leukopenia 3.7 will repeat CBC today   Review of Systems  Constitutional: Negative for weight loss.  HENT: Negative for hearing loss.   Eyes: Negative for blurred vision.  Respiratory: Negative for shortness of breath.   Cardiovascular: Negative for chest pain.  Gastrointestinal: Negative for abdominal pain.  Genitourinary:       Denies vag discharge   Musculoskeletal: Negative for falls.  Skin: Negative for rash.  Neurological: Negative for headaches.  Psychiatric/Behavioral: Negative for depression.   Past Medical History:  Diagnosis Date  . Migraines    since childhood   Past Surgical History:  Procedure Laterality Date  . BREAST BIOPSY Right 2014   stereo- neg  . NO PAST SURGERIES     Family History  Problem Relation Age of Onset  . Heart disease Mother   . Heart disease Father        mi  . Hyperlipidemia Father   . Hypertension Father   . Cancer Sister        triple neg breast  . Breast cancer Sister 30  . Stroke Maternal Grandmother   . Cancer Sister        cervical age 48 y.o    Social History   Socioeconomic History  . Marital status: Married    Spouse name: Not on file  . Number of children: Not on file  . Years of education: Not on file  . Highest education level: Not on file  Occupational History  . Not on file  Social Needs  . Financial resource strain: Not on file  . Food insecurity:    Worry: Not on file    Inability: Not on file  . Transportation needs:    Medical: Not on file    Non-medical: Not on file  Tobacco Use  . Smoking status: Never Smoker  . Smokeless tobacco: Never Used  Substance and Sexual Activity  . Alcohol use: Yes    Alcohol/week: 5.0 standard drinks    Types: 5 Cans of beer per week  . Drug use: No  . Sexual activity: Not on file  Lifestyle  . Physical activity:    Days per week: Not  on file    Minutes per session: Not on file  . Stress: Not on file  Relationships  . Social connections:    Talks on phone: Not on file    Gets together: Not on file    Attends religious service: Not on file    Active member of club or organization: Not on file    Attends meetings of clubs or organizations: Not on file    Relationship status: Not on file  . Intimate partner violence:    Fear of current or ex partner: Not on file    Emotionally abused: Not on file    Physically abused: Not on file    Forced sexual activity: Not on file  Other Topics Concern  . Not on file  Social History Narrative   Married wife Tanja Port from [place] in 2018    Never smoker    No guns, wears seat belt, safe in relationship    Current Meds  Medication Sig  . SUMAtriptan (IMITREX) 50 MG tablet TAKE ONE TABLET BY MOUTH FOR HEADACHE prn , CAN REPEAT IN 2 HOURS if needed max 200 mg daily  No Known Allergies Recent Results (from the past 2160 hour(s))  Measles/Mumps/Rubella Immunity     Status: Abnormal   Collection Time: 06/21/18  8:30 AM  Result Value Ref Range   Rubeola IgG <13.50 (L) AU/mL    Comment: AU/mL            Interpretation -----            -------------- <13.50           Negative 13.50-16.49      Equivocal >16.49           Positive . A positive result indicates that the patient has antibody to measles virus. It does not differentiate  between an active or past infection. The clinical  diagnosis must be interpreted in conjunction with  clinical signs and symptoms of the patient.    Mumps IgG <9.00 (L) AU/mL    Comment:  AU/mL           Interpretation -------         ---------------- <9.00             Negative 9.00-10.99        Equivocal >10.99            Positive A positive result indicates that the patient has  antibody to mumps virus. It does not differentiate between an  active or past infection. The clinical diagnosis must be interpreted in  conjunction with clinical signs and symptoms of the patient. .    Rubella 1.47 index    Comment:     Index            Interpretation     -----            --------------       <0.90            Not consistent with Immunity     0.90-0.99        Equivocal     > or = 1.00      Consistent with Immunity  . The presence of rubella IgG antibody suggests  immunization or past or current infection with rubella virus.   Hepatitis B surface antibody,quantitative     Status: Abnormal   Collection Time: 06/21/18  8:30 AM  Result Value Ref Range   Hepatitis B-Post <5 (L) > OR = 10 mIU/mL    Comment: . Patient does not have immunity to hepatitis B virus. . For additional information, please refer to http://education.questdiagnostics.com/faq/FAQ105 (This link is being provided for informational/ educational purposes only).   Vitamin D (25 hydroxy)     Status: None   Collection Time: 06/21/18  8:30 AM  Result Value Ref Range   VITD 36.61 30.00 - 100.00 ng/mL  T4, free     Status: None   Collection Time: 06/21/18  8:30 AM  Result Value Ref Range   Free T4 0.92 0.60 - 1.60 ng/dL    Comment: Specimens from patients who are undergoing biotin therapy and /or ingesting biotin supplements may contain high levels of biotin.  The higher biotin concentration in these specimens interferes with this Free T4 assay.  Specimens that contain high levels  of biotin may cause false high results for this Free T4 assay.  Please interpret results in light of the total clinical presentation of the patient.    TSH     Status: None   Collection Time: 06/21/18  8:30 AM  Result Value Ref Range   TSH 2.88 0.35 -  4.50 uIU/mL  Lipid panel     Status: Abnormal   Collection Time: 06/21/18  8:30 AM  Result Value Ref Range   Cholesterol 186 0 - 200 mg/dL    Comment: ATP III Classification       Desirable:  < 200 mg/dL               Borderline High:  200 - 239 mg/dL          High:  > = 240 mg/dL   Triglycerides 59.0 0.0 -  149.0 mg/dL    Comment: Normal:  <150 mg/dLBorderline High:  150 - 199 mg/dL   HDL 67.50 >39.00 mg/dL   VLDL 11.8 0.0 - 40.0 mg/dL   LDL Cholesterol 107 (H) 0 - 99 mg/dL   Total CHOL/HDL Ratio 3     Comment:                Men          Women1/2 Average Risk     3.4          3.3Average Risk          5.0          4.42X Average Risk          9.6          7.13X Average Risk          15.0          11.0                       NonHDL 118.71     Comment: NOTE:  Non-HDL goal should be 30 mg/dL higher than patient's LDL goal (i.e. LDL goal of < 70 mg/dL, would have non-HDL goal of < 100 mg/dL)  CBC with Differential/Platelet     Status: Abnormal   Collection Time: 06/21/18  8:30 AM  Result Value Ref Range   WBC 3.7 (L) 4.0 - 10.5 K/uL   RBC 4.31 3.87 - 5.11 Mil/uL   Hemoglobin 14.1 12.0 - 15.0 g/dL   HCT 41.7 36.0 - 46.0 %   MCV 96.9 78.0 - 100.0 fl   MCHC 33.9 30.0 - 36.0 g/dL   RDW 13.7 11.5 - 15.5 %   Platelets 289.0 150.0 - 400.0 K/uL   Neutrophils Relative % 55.8 43.0 - 77.0 %   Lymphocytes Relative 30.2 12.0 - 46.0 %   Monocytes Relative 12.6 (H) 3.0 - 12.0 %   Eosinophils Relative 0.6 0.0 - 5.0 %   Basophils Relative 0.8 0.0 - 3.0 %   Neutro Abs 2.1 1.4 - 7.7 K/uL   Lymphs Abs 1.1 0.7 - 4.0 K/uL   Monocytes Absolute 0.5 0.1 - 1.0 K/uL   Eosinophils Absolute 0.0 0.0 - 0.7 K/uL   Basophils Absolute 0.0 0.0 - 0.1 K/uL  Comprehensive metabolic panel     Status: None   Collection Time: 06/21/18  8:30 AM  Result Value Ref Range   Sodium 139 135 - 145 mEq/L   Potassium 3.9 3.5 - 5.1 mEq/L   Chloride 103 96 - 112 mEq/L   CO2 30 19 - 32 mEq/L   Glucose, Bld 88 70 - 99 mg/dL   BUN 9 6 - 23 mg/dL   Creatinine, Ser 0.58 0.40 - 1.20 mg/dL   Total Bilirubin 0.4 0.2 - 1.2 mg/dL   Alkaline Phosphatase 85 39 - 117 U/L   AST 27 0 - 37 U/L   ALT 20  0 - 35 U/L   Total Protein 6.9 6.0 - 8.3 g/dL   Albumin 4.3 3.5 - 5.2 g/dL   Calcium 9.5 8.4 - 10.5 mg/dL   GFR 108.90 >60.00 mL/min  Urinalysis,  Routine w reflex microscopic     Status: Abnormal   Collection Time: 06/21/18  8:31 AM  Result Value Ref Range   Color, Urine YELLOW YELLOW   APPearance CLOUDY (A) CLEAR   Specific Gravity, Urine 1.014 1.001 - 1.03   pH > OR = 8.5 (A) 5.0 - 8.0   Glucose, UA NEGATIVE NEGATIVE   Bilirubin Urine NEGATIVE NEGATIVE   Ketones, ur NEGATIVE NEGATIVE   Hgb urine dipstick NEGATIVE NEGATIVE   Protein, ur NEGATIVE NEGATIVE   Nitrite NEGATIVE NEGATIVE   Leukocytes, UA NEGATIVE NEGATIVE   Objective  Body mass index is 23.2 kg/m. Wt Readings from Last 3 Encounters:  07/18/18 139 lb 6.4 oz (63.2 kg)  06/20/18 137 lb 3.2 oz (62.2 kg)  02/27/17 132 lb (59.9 kg)   Temp Readings from Last 3 Encounters:  07/18/18 97.8 F (36.6 C) (Oral)  06/20/18 97.8 F (36.6 C) (Oral)  02/27/17 97.7 F (36.5 C) (Tympanic)   BP Readings from Last 3 Encounters:  07/18/18 (!) 98/58  06/20/18 106/63  02/27/17 100/78   Pulse Readings from Last 3 Encounters:  07/18/18 (!) 56  06/20/18 69  02/27/17 (!) 56    Physical Exam Vitals signs and nursing note reviewed. Exam conducted with a chaperone present.  Constitutional:      Appearance: Normal appearance. She is well-developed, well-groomed and normal weight.  HENT:     Head: Normocephalic and atraumatic.     Nose: Nose normal.     Mouth/Throat:     Mouth: Mucous membranes are moist.     Pharynx: Oropharynx is clear.  Eyes:     Conjunctiva/sclera: Conjunctivae normal.     Pupils: Pupils are equal, round, and reactive to light.  Cardiovascular:     Rate and Rhythm: Normal rate and regular rhythm.     Heart sounds: Normal heart sounds. No murmur.  Pulmonary:     Effort: Pulmonary effort is normal.     Breath sounds: Normal breath sounds.  Genitourinary:    Pubic Area: No rash.      Labia:        Right: Rash present.        Left: Rash present.      Vagina: Normal.     Cervix: Normal.     Uterus: Normal.      Adnexa: Right adnexa normal and left  adnexa normal.    Skin:    General: Skin is warm and dry.  Neurological:     General: No focal deficit present.     Mental Status: She is alert and oriented to person, place, and time. Mental status is at baseline.     Gait: Gait normal.  Psychiatric:        Attention and Perception: Attention and perception normal.        Mood and Affect: Mood and affect normal.        Speech: Speech normal.        Behavior: Behavior normal. Behavior is cooperative.        Thought Content: Thought content normal.        Cognition and Memory: Cognition and memory normal.        Judgment: Judgment normal.     Assessment   1. Annual  2. Migraines  uncontrolled  3. Leukopenia  Plan  1. Declines flu shot  Tdap had 01/2019  Check hep B/MMR not protected disc vaccines today Mammogram had 07/17/18 needs repeat right dx mammo and [place] Colonoscopy referred and pt schedule Pap today  Dermatology no need for now eczema right forearm using vasoline for now  rec healthy diet and exercise she is veg.  2. F/u neurology 09/03/2018  3. Recheck cbc today   Records [facility] Dr. Levonne Spiller  Labs 03/21/12 no significant findings Provider: Dr. Olivia Mackie McLean-Scocuzza-Internal Medicine

## 2018-07-20 LAB — CYTOLOGY - PAP
Diagnosis: NEGATIVE
HPV: NOT DETECTED

## 2018-07-24 ENCOUNTER — Ambulatory Visit
Admission: RE | Admit: 2018-07-24 | Discharge: 2018-07-24 | Disposition: A | Discharge status: Home / Self Care | Payer: BLUE CROSS/BLUE SHIELD | Source: Ambulatory Visit | Attending: Internal Medicine | Admitting: Internal Medicine

## 2018-07-24 ENCOUNTER — Other Ambulatory Visit: Payer: Self-pay | Admitting: Internal Medicine

## 2018-07-24 DIAGNOSIS — R928 Other abnormal and inconclusive findings on diagnostic imaging of breast: Secondary | ICD-10-CM

## 2018-07-24 DIAGNOSIS — R922 Inconclusive mammogram: Secondary | ICD-10-CM | POA: Diagnosis not present

## 2018-07-26 ENCOUNTER — Ambulatory Visit
Admission: RE | Admit: 2018-07-26 | Discharge: 2018-07-26 | Disposition: A | Discharge status: Home / Self Care | Payer: BLUE CROSS/BLUE SHIELD | Source: Ambulatory Visit | Attending: Internal Medicine | Admitting: Internal Medicine

## 2018-07-26 DIAGNOSIS — R928 Other abnormal and inconclusive findings on diagnostic imaging of breast: Secondary | ICD-10-CM

## 2018-07-26 HISTORY — PX: BREAST BIOPSY: SHX20

## 2018-07-27 LAB — SURGICAL PATHOLOGY

## 2018-08-01 ENCOUNTER — Encounter
Admission: RE | Disposition: A | Discharge status: Home / Self Care | Payer: Self-pay | Source: Home / Self Care | Attending: Gastroenterology

## 2018-08-01 ENCOUNTER — Other Ambulatory Visit: Payer: Self-pay

## 2018-08-01 ENCOUNTER — Ambulatory Visit: Payer: BLUE CROSS/BLUE SHIELD | Admitting: Certified Registered"

## 2018-08-01 ENCOUNTER — Encounter: Payer: Self-pay | Admitting: *Deleted

## 2018-08-01 ENCOUNTER — Ambulatory Visit
Admission: RE | Admit: 2018-08-01 | Discharge: 2018-08-01 | Disposition: A | Discharge status: Home / Self Care | Payer: BLUE CROSS/BLUE SHIELD | Attending: Gastroenterology | Admitting: Gastroenterology

## 2018-08-01 DIAGNOSIS — K6289 Other specified diseases of anus and rectum: Secondary | ICD-10-CM | POA: Diagnosis not present

## 2018-08-01 DIAGNOSIS — Z79899 Other long term (current) drug therapy: Secondary | ICD-10-CM | POA: Insufficient documentation

## 2018-08-01 DIAGNOSIS — Z1211 Encounter for screening for malignant neoplasm of colon: Secondary | ICD-10-CM

## 2018-08-01 HISTORY — PX: COLONOSCOPY WITH PROPOFOL: SHX5780

## 2018-08-01 SURGERY — COLONOSCOPY WITH PROPOFOL
Anesthesia: General

## 2018-08-01 MED ORDER — PROPOFOL 10 MG/ML IV BOLUS
INTRAVENOUS | Status: DC | PRN
  Administered 2018-08-01 (×10): 50 mg via INTRAVENOUS

## 2018-08-01 MED ORDER — SODIUM CHLORIDE 0.9 % IV SOLN
INTRAVENOUS | Status: DC
  Administered 2018-08-01: 09:00:00 via INTRAVENOUS

## 2018-08-01 MED ORDER — PHENYLEPHRINE HCL 10 MG/ML IJ SOLN
INTRAMUSCULAR | Status: DC | PRN
  Administered 2018-08-01: 100 ug via INTRAVENOUS

## 2018-08-01 NOTE — H&P (Signed)
Veronica Antigua, MD [address] [place], [place], [place], [address] [address], [place], [place], [place], [address] Phone: [phone]  Fax: [phone]  Primary Care Physician:  McLean-Scocuzza, Nino Glow, MD   Pre-Procedure History & Physical: HPI:  Veronica Henderson is a 53 y.o. female is here for a colonoscopy.   Past Medical History:  Diagnosis Date  . Leukopenia   . Migraines    since childhood    Past Surgical History:  Procedure Laterality Date  . BREAST BIOPSY Right 2014   stereo- neg  . BREAST BIOPSY Right 07/26/2018   distortion bx with affirm, x marker, path pending  . NO PAST SURGERIES      Prior to Admission medications   Medication Sig Start Date End Date Taking? Authorizing Provider  SUMAtriptan (IMITREX) 50 MG tablet TAKE ONE TABLET BY MOUTH FOR HEADACHE prn , CAN REPEAT IN 2 HOURS if needed max 200 mg daily 06/20/18   McLean-Scocuzza, Nino Glow, MD    Allergies as of 07/11/2018  . (No Known Allergies)    Family History  Problem Relation Age of Onset  . Heart disease Mother   . Heart disease Father        mi  . Hyperlipidemia Father   . Hypertension Father   . AAA (abdominal aortic aneurysm) Father        ruptured died 33  . Cancer Sister        triple neg breast  . Breast cancer Sister 22  . Stroke Maternal Grandmother   . Cancer Sister        cervical age 8 y.o     Social History   Socioeconomic History  . Marital status: Married    Spouse name: Not on file  . Number of children: Not on file  . Years of education: Not on file  . Highest education level: Not on file  Occupational History  . Not on file  Social Needs  . Financial resource strain: Not on file  . Food insecurity:    Worry: Not on file    Inability: Not on file  . Transportation needs:    Medical: Not on file    Non-medical: Not on file  Tobacco Use  . Smoking status: Never Smoker  . Smokeless tobacco: Never Used  Substance and Sexual Activity  . Alcohol  use: Yes    Alcohol/week: 5.0 standard drinks    Types: 5 Cans of beer per week  . Drug use: No  . Sexual activity: Not on file  Lifestyle  . Physical activity:    Days per week: Not on file    Minutes per session: Not on file  . Stress: Not on file  Relationships  . Social connections:    Talks on phone: Not on file    Gets together: Not on file    Attends religious service: Not on file    Active member of club or organization: Not on file    Attends meetings of clubs or organizations: Not on file    Relationship status: Not on file  . Intimate partner violence:    Fear of current or ex partner: Not on file    Emotionally abused: Not on file    Physically abused: Not on file    Forced sexual activity: Not on file  Other Topics Concern  . Not on file  Social History Narrative   Married wife Tanja Port from [place] in 2018  Never smoker    No guns, wears seat belt, safe in relationship     Review of Systems: See HPI, otherwise negative ROS  Physical Exam: BP 115/82   Pulse (!) 59   Temp (!) 96.7 F (35.9 C) (Tympanic)   Resp 16   Ht 5\' 5"  (1.651 m)   Wt 63 kg   LMP 06/06/2012 (Approximate)   SpO2 100%   BMI 23.13 kg/m  General:   Alert,  pleasant and cooperative in NAD Head:  Normocephalic and atraumatic. Neck:  Supple; no masses or thyromegaly. Lungs:  Clear throughout to auscultation, normal respiratory effort.    Heart:  +S1, +S2, Regular rate and rhythm, No edema. Abdomen:  Soft, nontender and nondistended. Normal bowel sounds, without guarding, and without rebound.   Neurologic:  Alert and  oriented x4;  grossly normal neurologically.  Impression/Plan: Veronica Henderson is here for a colonoscopy to be performed for average risk screening.  Risks, benefits, limitations, and alternatives regarding  colonoscopy have been reviewed with the patient.  Questions have been answered.  All parties agreeable.   Virgel Manifold, MD  08/01/2018,  8:44 AM

## 2018-08-01 NOTE — Anesthesia Post-op Follow-up Note (Signed)
Anesthesia QCDR form completed.        

## 2018-08-01 NOTE — Op Note (Signed)
[facility] Patient Name: Veronica Henderson Procedure Date: 08/01/2018 8:55 AM MRN: [id number] Account #: [id number] Date of Birth: 1965-11-10 Admit Type: Outpatient Age: 53 Room: [facility] ENDO ROOM 4 Gender: Female Note Status: Finalized Procedure:            Colonoscopy Indications:          Screening for colorectal malignant neoplasm Providers:            Magdalena Skilton B. Bonna Gains MD, MD Referring MD:         Nino Glow Mclean-Scocuzza MD, MD (Referring MD) Medicines:            Monitored Anesthesia Care Complications:        No immediate complications. Procedure:            Pre-Anesthesia Assessment:                       - Prior to the procedure, a History and Physical was                        performed, and patient medications, allergies and                        sensitivities were reviewed. The patient's tolerance of                        previous anesthesia was reviewed.                       - The risks and benefits of the procedure and the                        sedation options and risks were discussed with the                        patient. All questions were answered and informed                        consent was obtained.                       - Patient identification and proposed procedure were                        verified prior to the procedure by the physician, the                        nurse, the anesthetist and the technician. The                        procedure was verified in the pre-procedure area in the                        procedure room in the endoscopy suite.                       - ASA Grade Assessment: II - A patient with mild                        systemic disease.                       -  After reviewing the risks and benefits, the patient                        was deemed in satisfactory condition to undergo the                        procedure.                       After obtaining informed consent, the colonoscope  was                        passed under direct vision. Throughout the procedure,                        the patient's blood pressure, pulse, and oxygen                        saturations were monitored continuously. The                        Colonoscope was introduced through the anus and                        advanced to the the cecum, identified by appendiceal                        orifice and ileocecal valve. The colonoscopy was                        performed with ease. The patient tolerated the                        procedure well. The quality of the bowel preparation                        was good. Findings:      The perianal and digital rectal examinations were normal.      The rectum, sigmoid colon, descending colon, transverse colon, ascending       colon and cecum appeared normal.      Anal papilla(e) were hypertrophied.      The retroflexed view of the distal rectum and anal verge was normal and       showed no anal or rectal abnormalities. Impression:           - The rectum, sigmoid colon, descending colon,                        transverse colon, ascending colon and cecum are normal.                       - Anal papilla(e) were hypertrophied.                       - The distal rectum and anal verge are normal on                        retroflexion view.                       - No specimens collected. Recommendation:       -  Discharge patient to home.                       - Resume previous diet.                       - Continue present medications.                       - Repeat colonoscopy in 10 years for screening purposes.                       - Return to primary care physician as previously                        scheduled.                       - The findings and recommendations were discussed with                        the patient.                       - The findings and recommendations were discussed with                        the patient's family.                        - In the future, if patient develops new symptoms such                        as blood per rectum, abdominal pain, weight loss,                        altered bowel habits or any other reason for concern,                        patient should discuss this with thier PCP as they may                        need a GI referral at that time or evaluation for need                        for colonoscopy earlier than the recommended screening                        colonoscopy.                       In addition, if patient's family history of colon                        cancer changes (no family history at this time) in the                        future, earlier screening may be indicated and patient                        should discuss this with PCP as well. Procedure Code(s):    --- Professional ---  45378, Colonoscopy, flexible; diagnostic, including                        collection of specimen(s) by brushing or washing, when                        performed (separate procedure) Diagnosis Code(s):    --- Professional ---                       Z12.11, Encounter for screening for malignant neoplasm                        of colon                       K62.89, Other specified diseases of anus and rectum CPT copyright 2018 American Medical Association. All rights reserved. The codes documented in this report are preliminary and upon coder review may  be revised to meet current compliance requirements.  Vonda Antigua, MD Margretta Sidle B. Bonna Gains MD, MD 08/01/2018 9:24:52 AM This report has been signed electronically. Number of Addenda: 0 Note Initiated On: 08/01/2018 8:55 AM Scope Withdrawal Time: 0 hours 11 minutes 43 seconds  Total Procedure Duration: 0 hours 20 minutes 52 seconds       [facility]

## 2018-08-01 NOTE — Transfer of Care (Signed)
Immediate Anesthesia Transfer of Care Note  Patient: Veronica Henderson  Procedure(s) Performed: COLONOSCOPY WITH PROPOFOL (N/A )  Patient Location: PACU and Endoscopy Unit  Anesthesia Type:General  Level of Consciousness: drowsy and responds to stimulation  Airway & Oxygen Therapy: Patient Spontanous Breathing and Patient connected to nasal cannula oxygen  Post-op Assessment: Report given to RN and Post -op Vital signs reviewed and stable  Post vital signs: Reviewed and stable  Last Vitals:  Vitals Value Taken Time  BP    Temp    Pulse 62 08/01/2018  9:26 AM  Resp 15 08/01/2018  9:26 AM  SpO2 100 % 08/01/2018  9:26 AM  Vitals shown include unvalidated device data.  Last Pain:  Vitals:   08/01/18 0823  TempSrc: Tympanic         Complications: No apparent anesthesia complications

## 2018-08-01 NOTE — Anesthesia Preprocedure Evaluation (Signed)
Anesthesia Evaluation  Patient identified by MRN, date of birth, ID band Patient awake    Reviewed: Allergy & Precautions, NPO status , Patient's Chart, lab work & pertinent test results  History of Anesthesia Complications Negative for: history of anesthetic complications  Airway Mallampati: II  TM Distance: >3 FB Neck ROM: Full    Dental no notable dental hx.    Pulmonary neg pulmonary ROS, neg sleep apnea, neg COPD,    breath sounds clear to auscultation- rhonchi (-) wheezing      Cardiovascular Exercise Tolerance: Good (-) hypertension(-) CAD and (-) Past MI  Rhythm:Regular Rate:Normal - Systolic murmurs and - Diastolic murmurs    Neuro/Psych  Headaches, negative psych ROS   GI/Hepatic negative GI ROS, Neg liver ROS,   Endo/Other  negative endocrine ROSneg diabetes  Renal/GU negative Renal ROS     Musculoskeletal negative musculoskeletal ROS (+)   Abdominal (+) - obese,   Peds  Hematology negative hematology ROS (+)   Anesthesia Other Findings Past Medical History: No date: Leukopenia No date: Migraines     Comment:  since childhood   Reproductive/Obstetrics                             Anesthesia Physical Anesthesia Plan  ASA: II  Anesthesia Plan: General   Post-op Pain Management:    Induction: Intravenous  PONV Risk Score and Plan: 2 and Propofol infusion  Airway Management Planned: Natural Airway  Additional Equipment:   Intra-op Plan:   Post-operative Plan:   Informed Consent: I have reviewed the patients History and Physical, chart, labs and discussed the procedure including the risks, benefits and alternatives for the proposed anesthesia with the patient or authorized representative who has indicated his/her understanding and acceptance.     Dental advisory given  Plan Discussed with: CRNA and Anesthesiologist  Anesthesia Plan Comments:          Anesthesia Quick Evaluation

## 2018-08-01 NOTE — Anesthesia Postprocedure Evaluation (Signed)
Anesthesia Post Note  Patient: Veronica Henderson  Procedure(s) Performed: COLONOSCOPY WITH PROPOFOL (N/A )  Patient location during evaluation: Endoscopy Anesthesia Type: General Level of consciousness: awake and alert and oriented Pain management: pain level controlled Vital Signs Assessment: post-procedure vital signs reviewed and stable Respiratory status: spontaneous breathing, nonlabored ventilation and respiratory function stable Cardiovascular status: blood pressure returned to baseline and stable Postop Assessment: no signs of nausea or vomiting Anesthetic complications: no     Last Vitals:  Vitals:   08/01/18 0945 08/01/18 0955  BP: 93/61 (!) 87/63  Pulse: (!) 49 (!) 55  Resp: 16 18  Temp:    SpO2: 100% 99%    Last Pain:  Vitals:   08/01/18 0823  TempSrc: Tympanic                 Theresa Dohrman

## 2018-08-02 ENCOUNTER — Encounter: Payer: Self-pay | Admitting: Gastroenterology

## 2018-08-03 ENCOUNTER — Ambulatory Visit: Payer: BLUE CROSS/BLUE SHIELD | Admitting: Surgery

## 2018-08-07 ENCOUNTER — Ambulatory Visit: Payer: BLUE CROSS/BLUE SHIELD | Admitting: General Surgery

## 2018-08-08 ENCOUNTER — Ambulatory Visit: Payer: BLUE CROSS/BLUE SHIELD | Admitting: Neurology

## 2018-09-03 ENCOUNTER — Ambulatory Visit: Payer: BLUE CROSS/BLUE SHIELD | Admitting: Neurology

## 2018-09-11 ENCOUNTER — Other Ambulatory Visit: Payer: Self-pay

## 2018-09-11 ENCOUNTER — Encounter: Payer: Self-pay | Admitting: General Surgery

## 2018-09-11 ENCOUNTER — Ambulatory Visit (INDEPENDENT_AMBULATORY_CARE_PROVIDER_SITE_OTHER): Payer: BLUE CROSS/BLUE SHIELD | Admitting: General Surgery

## 2018-09-11 VITALS — BP 120/75 | HR 60 | Temp 97.5°F | Resp 16 | Ht 65.0 in | Wt 137.4 lb

## 2018-09-11 DIAGNOSIS — R928 Other abnormal and inconclusive findings on diagnostic imaging of breast: Secondary | ICD-10-CM | POA: Diagnosis not present

## 2018-09-11 NOTE — Patient Instructions (Addendum)
Patient will need to return to the office in 6 months with a bilateral breast mammogram.    Call the office with any questions or concerns.

## 2018-09-11 NOTE — Progress Notes (Signed)
Patient ID: Veronica Henderson, female   DOB: 05/12/1966, 52 y.o.   MRN: [id number]  Chief Complaint  Patient presents with  . New Patient (Initial Visit)     new Pt , benign breast biopsy, patient referred by [place] Poli,    HPI Veronica Henderson is a 53 y.o. female here today as a new patient referred by the [facility] for a benign right breast biopsy. Patient states she has no pain and no lumps. States she is felling well.   Past Medical History:  Diagnosis Date  . Leukopenia   . Migraines    since childhood    Past Surgical History:  Procedure Laterality Date  . BREAST BIOPSY Right 2014   stereo- neg  . BREAST BIOPSY Right 07/26/2018   distortion bx with affirm, x marker, path pending  . COLONOSCOPY WITH PROPOFOL N/A 08/01/2018   Procedure: COLONOSCOPY WITH PROPOFOL;  Surgeon: Virgel Manifold, MD;  Location: [facility] ENDOSCOPY;  Service: Endoscopy;  Laterality: N/A;  . NO PAST SURGERIES      Family History  Problem Relation Age of Onset  . Heart disease Mother   . Heart disease Father        mi  . Hyperlipidemia Father   . Hypertension Father   . AAA (abdominal aortic aneurysm) Father        ruptured died 66  . Cancer Sister        triple neg breast  . Breast cancer Sister 34  . Stroke Maternal Grandmother   . Cancer Sister        cervical age 42 y.o     Social History Social History   Tobacco Use  . Smoking status: Never Smoker  . Smokeless tobacco: Never Used  Substance Use Topics  . Alcohol use: Yes    Alcohol/week: 5.0 standard drinks    Types: 5 Cans of beer per week  . Drug use: No    No Known Allergies  Current Outpatient Medications  Medication Sig Dispense Refill  . SUMAtriptan (IMITREX) 50 MG tablet TAKE ONE TABLET BY MOUTH FOR HEADACHE prn , CAN REPEAT IN 2 HOURS if needed max 200 mg daily 10 tablet 2   No current facility-administered medications for this visit.     Review of Systems Review of Systems  Constitutional: Negative.    Respiratory: Negative.   Cardiovascular: Negative.     Blood pressure 120/75, pulse 60, temperature (!) 97.5 F (36.4 C), temperature source Temporal, resp. rate 16, height 5\' 5"  (1.651 m), weight 137 lb 6.4 oz (62.3 kg), last menstrual period 06/06/2012, SpO2 100 %.  Physical Exam Physical Exam Constitutional:      Appearance: She is well-developed.  Eyes:     General: No scleral icterus.    Conjunctiva/sclera: Conjunctivae normal.  Chest:     Breasts: Breasts are symmetrical.        Right: No inverted nipple, mass, nipple discharge, skin change or tenderness.        Left: No inverted nipple, mass, nipple discharge, skin change or tenderness.    Lymphadenopathy:     Cervical: No cervical adenopathy.     Upper Body:     Right upper body: No supraclavicular or axillary adenopathy.     Left upper body: No supraclavicular or axillary adenopathy.  Skin:    General: Skin is warm and dry.  Neurological:     Mental Status: She is alert and oriented to person, place, and time.     Data Reviewed Mammogram  images from 2014 completed in [place] as well as the recently completed mammograms of February 25 through July 26, 2018 were independently reviewed.  The area of concern on the 2020 films is immediately posterior to the area of the 2014 biopsy.  Post biopsy imaging shows the area well sampled.  July 26, 2018 biopsy:  A. BREAST, RIGHT UPPER OUTER; STEREOTACTIC BIOPSY:  - BENIGN BREAST TISSUE WITH STROMAL FIBROSIS, RUPTURED DUCT ECTASIA,  SCLEROSING ADENOSIS, MICROCYST FORMATION, FIBROADENOMATOID CHANGE, AND  APOCRINE METAPLASIA.  - FOCAL CHANGES CONSISTENT WITH PRIOR BIOPSY SITE.  - SEE COMMENT.  - CALCIFICATION ASSOCIATED WITH SCLEROSING ADENOSIS, ATROPHIC MAMMARY  EPITHELIUM, AND STROMAL FIBROSIS.  - NEGATIVE FOR ATYPIA AND MALIGNANCY.  Comment:  The differential diagnosis for the above findings includes a complex  sclerosing lesion and fibrocystic changes with  superimposed reactive  changes secondary to prior biopsy in this area. The former is favored.  Correlation with clinical impression and radiographic findings is  required.  (2 cm of tissue removed).  Assessment Architectural distortion on 3D imaging without atypia.  Plan Options for management reviewed: 1) surgical excision versus 2) mammographic observation.  Pros and cons of each reviewed.  Importance of breast self examination based on her modest breast volume and thin body habitus reviewed.  Considering the volume of tissue removed, the location in the field of previous biopsy and the absence of any atypia, I think observation is a reasonable option.  The patient was amenable to having her 2014 pathology report retrieved from the [facility] in [place].  We will plan for follow-up right breast diagnostic mammogram in September 2020.  The patient was advised that should she desire to proceed to biopsy before that time she was welcome to call.  Surgical biopsy procedure was reviewed including the need for wire localization).    HPI, Physical Exam, Assessment and Plan have been scribed under the direction and in the presence of Hervey Ard, Md.  Eudelia Bunch R. Bobette Mo, CMA  I have completed the exam and reviewed the above documentation for accuracy and completeness.  I agree with the above.  Haematologist has been used and any errors in dictation or transcription are unintentional.  Hervey Ard, M.D., F.A.C.S.  Forest Gleason Prynce Jacober 09/11/2018, 10:36 AM

## 2018-09-13 ENCOUNTER — Telehealth: Payer: Self-pay | Admitting: *Deleted

## 2018-09-13 NOTE — Telephone Encounter (Signed)
I did call and speak with Veronica Henderson (Phone: [phone]) this morning.   They stated that they did not receive request for records on 09-11-18 for 2014 pathology report from breast biopsy.   Their office did provide me with another number to fax request to- Fax: [phone].  I did call and got confirmation they received this request. Their office states the request will be forwarded to a third party who will take care of getting records to our office.

## 2018-09-25 ENCOUNTER — Encounter: Payer: Self-pay | Admitting: Gastroenterology

## 2018-09-26 ENCOUNTER — Telehealth: Payer: Self-pay | Admitting: *Deleted

## 2018-09-26 NOTE — Telephone Encounter (Signed)
I did receive a notification in the mail from [facility] that states they are unable to comply with our medical records request due to the fact that they show no treatment at this facility for the dates of service requested- 2014-2020.   Message left for patient to call the office to notify her of the above and verify dates and/or see if the ordering physician may have the records.

## 2018-10-10 ENCOUNTER — Encounter: Payer: Self-pay | Admitting: *Deleted

## 2018-10-10 NOTE — Telephone Encounter (Signed)
Tried to call patient again today since she has not called back in regards to pathology report.   Call would not go through.   Letter mailed.

## 2018-11-30 NOTE — Telephone Encounter (Signed)
There is been difficulty in obtaining records of her [place] biopsy and her wishes.  She will hopefully return for September diagnostic mammograms to reassess the area biopsied earlier this year.

## 2018-12-11 ENCOUNTER — Encounter: Payer: Self-pay | Admitting: General Surgery

## 2018-12-27 ENCOUNTER — Other Ambulatory Visit: Payer: Self-pay

## 2018-12-27 DIAGNOSIS — R928 Other abnormal and inconclusive findings on diagnostic imaging of breast: Secondary | ICD-10-CM

## 2019-01-10 ENCOUNTER — Other Ambulatory Visit: Payer: Self-pay | Admitting: Internal Medicine

## 2019-01-10 ENCOUNTER — Telehealth: Payer: Self-pay | Admitting: Internal Medicine

## 2019-01-10 DIAGNOSIS — G43909 Migraine, unspecified, not intractable, without status migrainosus: Secondary | ICD-10-CM

## 2019-01-10 MED ORDER — SUMATRIPTAN SUCCINATE 50 MG PO TABS: ORAL_TABLET | ORAL | 2 refills | Status: DC

## 2019-01-10 NOTE — Telephone Encounter (Signed)
Please sch neurology referral placed 05/2018 for migraines   Thanks Loleta

## 2019-02-20 ENCOUNTER — Ambulatory Visit
Admission: RE | Admit: 2019-02-20 | Discharge: 2019-02-20 | Disposition: A | Discharge status: Home / Self Care | Payer: BC Managed Care – PPO | Source: Ambulatory Visit | Attending: Surgery | Admitting: Surgery

## 2019-02-20 DIAGNOSIS — R928 Other abnormal and inconclusive findings on diagnostic imaging of breast: Secondary | ICD-10-CM | POA: Diagnosis not present

## 2019-02-20 DIAGNOSIS — R922 Inconclusive mammogram: Secondary | ICD-10-CM | POA: Diagnosis not present

## 2019-02-21 ENCOUNTER — Telehealth: Payer: Self-pay | Admitting: *Deleted

## 2019-02-21 NOTE — Telephone Encounter (Signed)
Left message for patient to call back in regards to her mammogram results,. Dr.Pabon wants a 6 month f/u bilateral mammogram. I placed in recalls

## 2019-02-25 ENCOUNTER — Ambulatory Visit: Payer: BC Managed Care – PPO | Admitting: Neurology

## 2019-02-25 ENCOUNTER — Other Ambulatory Visit: Payer: Self-pay

## 2019-02-25 ENCOUNTER — Encounter: Payer: Self-pay | Admitting: Neurology

## 2019-02-25 VITALS — BP 119/73 | HR 57 | Temp 98.2°F | Ht 65.0 in | Wt 137.0 lb

## 2019-02-25 DIAGNOSIS — G441 Vascular headache, not elsewhere classified: Secondary | ICD-10-CM | POA: Diagnosis not present

## 2019-02-25 DIAGNOSIS — G43709 Chronic migraine without aura, not intractable, without status migrainosus: Secondary | ICD-10-CM

## 2019-02-25 DIAGNOSIS — R51 Headache with orthostatic component, not elsewhere classified: Secondary | ICD-10-CM

## 2019-02-25 DIAGNOSIS — R519 Headache, unspecified: Secondary | ICD-10-CM

## 2019-02-25 MED ORDER — AJOVY 225 MG/1.5ML ~~LOC~~ SOAJ: 225.0000 mg | SUBCUTANEOUS | 11 refills | Status: DC

## 2019-02-25 NOTE — Patient Instructions (Signed)
  Fremanezumab injection What is this medicine? FREMANEZUMAB (fre ma NEZ ue mab) is used to prevent migraine headaches. This medicine may be used for other purposes; ask your health care provider or pharmacist if you have questions. COMMON BRAND NAME(S): AJOVY What should I tell my health care provider before I take this medicine? They need to know if you have any of these conditions:  an unusual or allergic reaction to fremanezumab, other medicines, foods, dyes, or preservatives  pregnant or trying to get pregnant  breast-feeding How should I use this medicine? This medicine is for injection under the skin. You will be taught how to prepare and give this medicine. Use exactly as directed. Take your medicine at regular intervals. Do not take your medicine more often than directed. It is important that you put your used needles and syringes in a special sharps container. Do not put them in a trash can. If you do not have a sharps container, call your pharmacist or healthcare provider to get one. Talk to your pediatrician regarding the use of this medicine in children. Special care may be needed. Overdosage: If you think you have taken too much of this medicine contact a poison control center or emergency room at once. NOTE: This medicine is only for you. Do not share this medicine with others. What if I miss a dose? If you miss a dose, take it as soon as you can. If it is almost time for your next dose, take only that dose. Do not take double or extra doses. What may interact with this medicine? Interactions are not expected. This list may not describe all possible interactions. Give your health care provider a list of all the medicines, herbs, non-prescription drugs, or dietary supplements you use. Also tell them if you smoke, drink alcohol, or use illegal drugs. Some items may interact with your medicine. What should I watch for while using this medicine? Tell your doctor or healthcare  professional if your symptoms do not start to get better or if they get worse. What side effects may I notice from receiving this medicine? Side effects that you should report to your doctor or health care professional as soon as possible:  allergic reactions like skin rash, itching or hives, swelling of the face, lips, or tongue Side effects that usually do not require medical attention (report these to your doctor or health care professional if they continue or are bothersome):  pain, redness, or irritation at site where injected This list may not describe all possible side effects. Call your doctor for medical advice about side effects. You may report side effects to FDA at 1-800-FDA-1088. Where should I keep my medicine? Keep out of the reach of children. You will be instructed on how to store this medicine. Throw away any unused medicine after the expiration date on the label. NOTE: This sheet is a summary. It may not cover all possible information. If you have questions about this medicine, talk to your doctor, pharmacist, or health care provider.  2020 Elsevier/Gold Standard (2017-02-06 17:22:56)  

## 2019-02-25 NOTE — Progress Notes (Signed)
[facility]    Provider:  Dr Jaynee Eagles Requesting Provider: McLean-Scocuzza, Olivia Mackie * Primary Care Provider:  McLean-Scocuzza, Nino Glow, MD  CC:  Migraines  HPI:  Veronica Henderson is a 53 y.o. female here as requested by McLean-Scocuzza, Olivia Mackie * for migraines.  Past medical history migraines since childhood.  She has a family history in her sister and maternal grandmother. She has had headaches since a child. She knows when the headache is coming and if she doesn't have imitrex it can last days. No aura. o vision changes. It is in the right eye, behind the eye, can spread to the other side, severe, pounding pulsating, severe, no aura. Last week she had a migraine 4 days in a row. She took 4 imitrex pills last week. She gets anxious thinking about getting a headache. She is having migraines more frequently. Beer can trigger. The migraines can rebound back. She has 10 headache days a month. OTC doesn't work. She has new pain in the occipital area, and she can wake up the migraines.No other focal neurologic deficits, associated symptoms, inciting events or modifiable factors.   No other focal neurologic deficits, associated symptoms, inciting events or modifiable factors.   Reviewed notes, labs and imaging from outside physicians, which showed:  CBC with low white blood cells 3.5 otherwise normal, February 2020.  CMP was normal in January 2020.  I reviewed McLean-Scocuzza, Nino Glow, MD's notes.  Patient was seen in January of this year, she has had chronic migraines since a kid lasting 3 days, using 18 Imitrex daily over-the-counter medications.  She feels increased anxiety before the headache mostly they are in the right eye.  She has nausea vomiting, phonophobia, no significant photophobia, she is on ophthalmologist and per patient her eyes were okay, 2 to 3 cups of coffee a day, her sister and maternal grandmother had migraines which stopped at an older age 64s.  Sleeping 7 to 8 hours a  night.  She followed up with physician again in February of this year and followed for leukopenia.  Review of Systems: Patient complains of symptoms per HPI as well as the following symptoms: headache. Pertinent negatives and positives per HPI. All others negative.   Social History   Socioeconomic History  . Marital status: Married    Spouse name: Not on file  . Number of children: Not on file  . 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 Highest education level: High school graduate  Occupational History  . Not on file  Social Needs  . Financial resource strain: Not on file  . Food insecurity    Worry: Not on file    Inability: Not on file  . Transportation needs    Medical: Not on file    Non-medical: Not on file  Tobacco Use  . Smoking status: Never Smoker  . Smokeless tobacco: Never Used  Substance and Sexual Activity  . Alcohol use: Yes    Alcohol/week: 1.0 - 2.0 standard drinks    Types: 1 - 2 Cans of beer per week  . Drug use: No  . Sexual activity: Not on file  Lifestyle  . Physical activity    Days per week: Not on file    Minutes per session: Not on file  . Stress: Not on file  Relationships  . Social Herbalist on phone: Not on file    Gets together: Not on file    Attends religious service: Not  on file    Active member of club or organization: Not on file    Attends meetings of clubs or organizations: Not on file    Relationship status: Not on file  . Intimate partner violence    Fear of current or ex partner: Not on file    Emotionally abused: Not on file    Physically abused: Not on file    Forced sexual activity: Not on file  Other Topics Concern  . Not on file  Social History Narrative   Married wife Tanja Port from [place] in 2018    Never smoker    No guns, wears seat belt, safe in relationship    Self-employed    Family History  Problem Relation Age of Onset  . Heart disease Mother   . Heart  disease Father        mi  . Hyperlipidemia Father   . Hypertension Father   . AAA (abdominal aortic aneurysm) Father        ruptured died 23  . Cancer Sister        triple neg breast  . Breast cancer Sister 64  . Migraines Sister   . Stroke Maternal Grandmother   . Headache Maternal Grandmother   . Cancer Sister        cervical age 70 y.o   . Ankylosing spondylitis Sister     Past Medical History:  Diagnosis Date  . Leukopenia   . Migraines    since childhood    Patient Active Problem List   Diagnosis Date Noted  . Chronic migraine without aura without status migrainosus, not intractable 02/25/2019  . Abnormal mammogram 09/11/2018  . Encounter for screening colonoscopy   . Annual physical exam 07/18/2018  . Migraines 02/27/2017    Past Surgical History:  Procedure Laterality Date  . BREAST BIOPSY Right 2014   stereo- neg  . BREAST BIOPSY Right 07/26/2018   distortion bx with affirm, x marker, path pending  . COLONOSCOPY WITH PROPOFOL N/A 08/01/2018   Procedure: COLONOSCOPY WITH PROPOFOL;  Surgeon: Virgel Manifold, MD;  Location: [facility] ENDOSCOPY;  Service: Endoscopy;  Laterality: N/A;  . NO PAST SURGERIES      Current Outpatient Medications  Medication Sig Dispense Refill  . IBUPROFEN PO Take by mouth as needed.    . SUMAtriptan (IMITREX) 50 MG tablet TAKE ONE TABLET BY MOUTH FOR HEADACHE prn , CAN REPEAT IN 2 HOURS if needed max 200 mg daily. Please follow with neurology for migraines 10 tablet 2  . Fremanezumab-vfrm (AJOVY) 225 MG/1.5ML SOAJ Inject 225 mg into the skin every 30 (thirty) days. 1 pen 11   No current facility-administered medications for this visit.     Allergies as of 02/25/2019  . (No Known Allergies)    Vitals: BP 119/73 (BP Location: Right Arm, Patient Position: Sitting)   Pulse (!) 57   Temp 98.2 F (36.8 C) Comment: taken by check-in staff  Ht 5\' 5"  (1.651 m)   Wt 137 lb (62.1 kg)   LMP 06/06/2012 (Approximate)   BMI 22.80 kg/m   Last Weight:  Wt Readings from Last 1 Encounters:  02/25/19 137 lb (62.1 kg)   Last Height:   Ht Readings from Last 1 Encounters:  02/25/19 5\' 5"  (1.651 m)     Physical exam: Exam: Gen: NAD, conversant, well nourised, thin, well groomed  CV: RRR, no MRG. No Carotid Bruits. No peripheral edema, warm, nontender Eyes: Conjunctivae clear without exudates or hemorrhage  Neuro: Detailed Neurologic Exam  Speech:    Speech is normal; fluent and spontaneous with normal comprehension.  Cognition:    The patient is oriented to person, place, and time;     recent and remote memory intact;     language fluent;     normal attention, concentration,     fund of knowledge Cranial Nerves:    The pupils are equal, round, and reactive to light. The fundi are normal and spontaneous venous pulsations are present. Visual fields are full to finger confrontation. Extraocular movements are intact. Trigeminal sensation is intact and the muscles of mastication are normal. The face is symmetric. The palate elevates in the midline. Hearing intact. Voice is normal. Shoulder shrug is normal. The tongue has normal motion without fasciculations.   Coordination:    No dysmetria or ataxia  Gait:    Normal native gait  Motor Observation:    No asymmetry, no atrophy, and no involuntary movements noted. Tone:    Normal muscle tone.    Posture:    Posture is normal. normal erect    Strength:    Strength is V/V in the upper and lower limbs.      Sensation: intact to LT     Reflex Exam:  DTR's:    Deep tendon reflexes in the upper and lower extremities are normal bilaterally.   Toes:    The toes are downgoing bilaterally.   Clonus:    Clonus is absent.    Assessment/Plan:  53 year old patient with chronic migraines. However given new occipital headaches and morning headaches need a thorough evaluation.  MRI of the brain w/wo contrast: MRI brain due to concerning symptoms of  morning headaches, occipital headaches,positional headaches to look for space occupying mass, chiari or intracranial hypertension (pseudotumor).  Start Ajovy: discussed oral meds she prefers not to take daily meds. She understands insurane will likely not cover after the copay program ends and we will have to try oral medications  Acute: Continue Imitrex. May need to change to longer acting due to migraines coming back, will monitor.  Discussed: To prevent or relieve headaches, try the following: Cool Compress. Lie down and place a cool compress on your head.  Avoid headache triggers. If certain foods or odors seem to have triggered your migraines in the past, avoid them. A headache diary might help you identify triggers.  Include physical activity in your daily routine. Try a daily walk or other moderate aerobic exercise.  Manage stress. Find healthy ways to cope with the stressors, such as delegating tasks on your to-do list.  Practice relaxation techniques. Try deep breathing, yoga, massage and visualization.  Eat regularly. Eating regularly scheduled meals and maintaining a healthy diet might help prevent headaches. Also, drink plenty of fluids.  Follow a regular sleep schedule. Sleep deprivation might contribute to headaches Consider biofeedback. With this mind-body technique, you learn to control certain bodily functions - such as muscle tension, heart rate and blood pressure - to prevent headaches or reduce headache pain.    Proceed to emergency room if you experience new or worsening symptoms or symptoms do not resolve, if you have new neurologic symptoms or if headache is severe, or for any concerning symptom.   Provided education and documentation from American headache Society toolbox including articles on: chronic migraine medication overuse headache, chronic migraines, prevention of migraines, behavioral  and other nonpharmacologic treatments for headache.    Orders Placed This  Encounter  Procedures  . MR BRAIN W WO CONTRAST   Meds ordered this encounter  Medications  . Fremanezumab-vfrm (AJOVY) 225 MG/1.5ML SOAJ    Sig: Inject 225 mg into the skin every 30 (thirty) days.    Dispense:  1 pen    Refill:  11    Patient has copay card; she can have medication for $5 regardless of insurance approval or copay amount.    Cc: McLean-Scocuzza, Catheryn Bacon, MD  [facility] [address] [place], [place] [address]  Phone [phone] Fax [phone]

## 2019-02-26 ENCOUNTER — Telehealth: Payer: Self-pay | Admitting: Neurology

## 2019-02-26 ENCOUNTER — Telehealth: Payer: Self-pay | Admitting: *Deleted

## 2019-02-26 NOTE — Telephone Encounter (Signed)
Completed Ajovy PA on CMM. KeyArlyn Dunning - Rx #: [id number]. Awaiting determination from [facility].   Per CMM: If [employer] has not responded in 3 business days or if you have any questions about your submission, contact [facility] at [phone].

## 2019-02-26 NOTE — Telephone Encounter (Signed)
LVM for pt to call back about scheduling mri  BCBS Auth: NPR via [employer]

## 2019-02-26 NOTE — Telephone Encounter (Signed)
Per BCBS, Ajovy denied d/t not meeting requirements of tried/failed medications. Also, plan requires Aimovig and Emgality trial first.   Pt will be able to use the savings card as previously discussed.  If we should choose to appeal, fax to [phone] within 180 days.

## 2019-02-27 ENCOUNTER — Ambulatory Visit: Payer: BLUE CROSS/BLUE SHIELD | Admitting: Surgery

## 2019-04-11 ENCOUNTER — Other Ambulatory Visit: Payer: Self-pay | Admitting: Internal Medicine

## 2019-04-11 DIAGNOSIS — G43909 Migraine, unspecified, not intractable, without status migrainosus: Secondary | ICD-10-CM

## 2019-04-11 MED ORDER — SUMATRIPTAN SUCCINATE 50 MG PO TABS: ORAL_TABLET | ORAL | 2 refills | Status: DC

## 2019-04-11 NOTE — Telephone Encounter (Signed)
Medication Refill - Medication: SUMAtriptan (IMITREX) 50 MG tablet Pt stated she contacted her pharmacy for a refill but they did not hear back. Pt is down to one tablet left.  Has the patient contacted their pharmacy? Yes.   (Agent: If no, request that the patient contact the pharmacy for the refill.) (Agent: If yes, when and what did the pharmacy advise?)  Preferred Pharmacy (with phone number or street name):  [id number] [name], [place] [phone] (Phone) [phone] (Fax)     Agent: Please be advised that RX refills may take up to 3 business days. We ask that you follow-up with your pharmacy.

## 2019-04-17 ENCOUNTER — Encounter: Payer: Self-pay | Admitting: *Deleted

## 2019-04-17 ENCOUNTER — Telehealth: Payer: Self-pay | Admitting: Internal Medicine

## 2019-04-17 NOTE — Telephone Encounter (Signed)
I think she needs to f/u with neurology

## 2019-04-17 NOTE — Telephone Encounter (Signed)
Rx refill SUMAtriptan (IMITREX) 50 MG tablet    Patient states [facility] [address], in [place],  Pharmacy has refused to fill medication. Patient states that pharmacy is saying that she has reached her max limit    Call back [phone]

## 2019-04-17 NOTE — Telephone Encounter (Signed)
I spoke with Aquisi, the [facility]ist[facility] at [facility]. She stated the refill had been ran as a 10 day supply instead of 30 and that's why it was blocked. They have fixed this and will fill it. I messaged the pt in mychart.

## 2019-04-17 NOTE — Telephone Encounter (Signed)
Patient has been informed.

## 2019-04-17 NOTE — Telephone Encounter (Signed)
Thanks I messaged the patient in [place].

## 2019-04-17 NOTE — Telephone Encounter (Signed)
The patient called our office. She stated she has filled the sumatriptan no more than once per month and her insurance stated she was ok to refill (due on the 15th). She has been stable since her appt with Dr. Jaynee Eagles on 02/25/2019. She is on the Ajovy. I told her I would call [facility] to look into [facility]. She did not ask for any different medications. I scheduled her for a 3 month f/u with Amy NP on Wed 06/04/2018 @ 8:30 AM. Pt verbalized appreciation. I will f/u with her in mychart after [facility] with [facility].

## 2019-06-05 ENCOUNTER — Ambulatory Visit: Payer: Self-pay | Admitting: Family Medicine

## 2019-06-05 ENCOUNTER — Encounter: Payer: Self-pay | Admitting: Family Medicine

## 2019-06-05 ENCOUNTER — Other Ambulatory Visit: Payer: Self-pay

## 2019-06-05 VITALS — BP 114/66 | HR 56 | Temp 97.9°F | Ht 65.0 in | Wt 140.4 lb

## 2019-06-05 DIAGNOSIS — G43709 Chronic migraine without aura, not intractable, without status migrainosus: Secondary | ICD-10-CM

## 2019-06-05 MED ORDER — AMITRIPTYLINE HCL 10 MG PO TABS: 10.0000 mg | ORAL_TABLET | Freq: Every day | ORAL | 3 refills | Status: DC

## 2019-06-05 NOTE — Progress Notes (Addendum)
PATIENT: Veronica Henderson DOB: Jul 28, 1965  REASON FOR VISIT: follow up HISTORY FROM: patient  Chief Complaint  Patient presents with  . Follow-up    RM9. alone. No questions nor concerns.     HISTORY OF PRESENT ILLNESS: Today 06/05/19 Veronica Henderson is a 54 y.o. female here today for follow up for migraines. She was started on Ajovy and Imitrex. She is unsure if headaches have improved. She has taken 4 Ajovy injections. She does not feel that headaches have changed at all. Pain is usually unilateral, retro orbital but sometimes starts in the back of her head. She gets sound sensitivity and nausea. No sensitivity to light. She has not identified triggers. She does feel that she gets anxious about headaches but is generally not an anxious person. She sleeps fairly well. No snoring. Usually 7-8 hours a night.   HISTORY: (copied from Dr Cathren Laine note on 02/25/2019)  HPI:  Veronica Henderson is a 54 y.o. female here as requested by McLean-Scocuzza, Olivia Mackie * for migraines.  Past medical history migraines since childhood.  She has a family history in her sister and maternal grandmother. She has had headaches since a child. She knows when the headache is coming and if she doesn't have imitrex it can last days. No aura. o vision changes. It is in the right eye, behind the eye, can spread to the other side, severe, pounding pulsating, severe, no aura. Last week she had a migraine 4 days in a row. She took 4 imitrex pills last week. She gets anxious thinking about getting a headache. She is having migraines more frequently. Beer can trigger. The migraines can rebound back. She has 10 headache days a month. OTC doesn't work. She has new pain in the occipital area, and she can wake up the migraines.No other focal neurologic deficits, associated symptoms, inciting events or modifiable factors.   No other focal neurologic deficits, associated symptoms, inciting events or modifiable factors.   Reviewed notes, labs and  imaging from outside physicians, which showed:  CBC with low white blood cells 3.5 otherwise normal, February 2020.  CMP was normal in January 2020.  I reviewed McLean-Scocuzza, Nino Glow, MD's notes.  Patient was seen in January of this year, she has had chronic migraines since a kid lasting 3 days, using 18 Imitrex daily over-the-counter medications.  She feels increased anxiety before the headache mostly they are in the right eye.  She has nausea vomiting, phonophobia, no significant photophobia, she is on ophthalmologist and per patient her eyes were okay, 2 to 3 cups of coffee a day, her sister and maternal grandmother had migraines which stopped at an older age 60s.  Sleeping 7 to 8 hours a night.  She followed up with physician again in February of this year and followed for leukopenia.   REVIEW OF SYSTEMS: Out of a complete 14 system review of symptoms, the patient complains only of the following symptoms, headaches and all other reviewed systems are negative.  ALLERGIES: No Known Allergies  HOME MEDICATIONS: Outpatient Medications Prior to Visit  Medication Sig Dispense Refill  . SUMAtriptan (IMITREX) 50 MG tablet TAKE ONE TABLET BY MOUTH FOR HEADACHE prn , CAN REPEAT IN 2 HOURS if needed max 200 mg daily. Please follow with neurology for migraines 10 tablet 2  . Fremanezumab-vfrm (AJOVY) 225 MG/1.5ML SOAJ Inject 225 mg into the skin every 30 (thirty) days. 1 pen 11  . IBUPROFEN PO Take by mouth as needed.     No facility-administered  medications prior to visit.    PAST MEDICAL HISTORY: Past Medical History:  Diagnosis Date  . Leukopenia   . Migraines    since childhood    PAST SURGICAL HISTORY: Past Surgical History:  Procedure Laterality Date  . BREAST BIOPSY Right 2014   stereo- neg  . BREAST BIOPSY Right 07/26/2018   distortion bx with affirm, x marker, path pending  . COLONOSCOPY WITH PROPOFOL N/A 08/01/2018   Procedure: COLONOSCOPY WITH PROPOFOL;  Surgeon:  Virgel Manifold, MD;  Location: [facility] ENDOSCOPY;  Service: Endoscopy;  Laterality: N/A;  . NO PAST SURGERIES      FAMILY HISTORY: Family History  Problem Relation Age of Onset  . Heart disease Mother   . Heart disease Father        mi  . Hyperlipidemia Father   . Hypertension Father   . AAA (abdominal aortic aneurysm) Father        ruptured died 64  . Cancer Sister        triple neg breast  . Breast cancer Sister 62  . Migraines Sister   . Stroke Maternal Grandmother   . Headache Maternal Grandmother   . Cancer Sister        cervical age 16 y.o   . Ankylosing spondylitis Sister     SOCIAL HISTORY: Social History   Socioeconomic History  . Marital status: Married    Spouse name: Not on file  . Number of children: Not on file  . 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 Highest education level: High school graduate  Occupational History  . Not on file  Tobacco Use  . Smoking status: Never Smoker  . Smokeless tobacco: Never Used  Substance and Sexual Activity  . Alcohol use: Yes    Alcohol/week: 1.0 - 2.0 standard drinks    Types: 1 - 2 Cans of beer per week  . Drug use: No  . Sexual activity: Not on file  Other Topics Concern  . Not on file  Social History Narrative   Married wife Tanja Port from [place] in 2018    Never smoker    No guns, wears seat belt, safe in relationship    Self-employed   Social Determinants of Health   Financial Resource Strain:   . Difficulty of Paying Living Expenses: Not on file  Food Insecurity:   . Worried About Charity fundraiser in the Last Year: Not on file  . Ran Out of Food in the Last Year: Not on file  Transportation Needs:   . Lack of Transportation (Medical): Not on file  . Lack of Transportation (Non-Medical): Not on file  Physical Activity:   . Days of Exercise per Week: Not on file  . Minutes of Exercise per Session: Not on file  Stress:   . Feeling of Stress : Not on file   Social Connections:   . Frequency of Communication with Friends and Family: Not on file  . Frequency of Social Gatherings with Friends and Family: Not on file  . Attends Religious Services: Not on file  . Active Member of Clubs or Organizations: Not on file  . Attends Archivist Meetings: Not on file  . Marital Status: Not on file  Intimate Partner Violence:   . Fear of Current or Ex-Partner: Not on file  . Emotionally Abused: Not on file  . Physically Abused: Not on file  . Sexually Abused: Not on  file      PHYSICAL EXAM  Vitals:   06/05/19 0829  BP: 114/66  Pulse: (!) 56  Temp: 97.9 F (36.6 C)  Weight: 140 lb 6.4 oz (63.7 kg)  Height: 5\' 5"  (1.651 m)   Body mass index is 23.36 kg/m.  Generalized: Well developed, in no acute distress  Cardiology: normal rate and rhythm, no murmur noted Respiratory: Clear to auscultation bilaterally Neurological examination  Mentation: Alert oriented to time, place, history taking. Follows all commands speech and language fluent Cranial nerve II-XII: Pupils were equal round reactive to light. Extraocular movements were full, visual field were full on confrontational test. Facial sensation and strength were normal. Uvula tongue midline. Head turning and shoulder shrug  were normal and symmetric. Motor: The motor testing reveals 5 over 5 strength of all 4 extremities. Good symmetric motor tone is noted throughout.  Sensory: Sensory testing is intact to soft touch on all 4 extremities. No evidence of extinction is noted.  Coordination: Cerebellar testing reveals good finger-nose-finger and heel-to-shin bilaterally.  Gait and station: Gait is normal.  Reflexes: Deep tendon reflexes are symmetric and normal bilaterally.   DIAGNOSTIC DATA (LABS, IMAGING, TESTING) - I reviewed patient records, labs, notes, testing and imaging myself where available.  No flowsheet data found.   Lab Results  Component Value Date   WBC 3.5 (L)  07/18/2018   HGB 13.8 07/18/2018   HCT 40.5 07/18/2018   MCV 97.2 07/18/2018   PLT 313.0 07/18/2018      Component Value Date/Time   NA 139 06/21/2018 0830   K 3.9 06/21/2018 0830   CL 103 06/21/2018 0830   CO2 30 06/21/2018 0830   GLUCOSE 88 06/21/2018 0830   BUN 9 06/21/2018 0830   CREATININE 0.58 06/21/2018 0830   CALCIUM 9.5 06/21/2018 0830   PROT 6.9 06/21/2018 0830   ALBUMIN 4.3 06/21/2018 0830   AST 27 06/21/2018 0830   ALT 20 06/21/2018 0830   ALKPHOS 85 06/21/2018 0830   BILITOT 0.4 06/21/2018 0830   Lab Results  Component Value Date   CHOL 186 06/21/2018   HDL 67.50 06/21/2018   LDLCALC 107 (H) 06/21/2018   TRIG 59.0 06/21/2018   CHOLHDL 3 06/21/2018   No results found for: HGBA1C No results found for: VITAMINB12 Lab Results  Component Value Date   TSH 2.88 06/21/2018     ASSESSMENT AND PLAN 54 y.o. year old female  has a past medical history of Leukopenia and Migraines. here with     ICD-10-CM   1. Chronic migraine without aura without status migrainosus, not intractable  G43.709 amitriptyline (ELAVIL) 10 MG tablet    Tiger reports that she has not noted any significant improvement in migraine or frequency after starting Ajovy.  She has taken a total of 4 injections with no changes in headaches.  We have discussed other options for prevention therapy including topiramate and amitriptyline.  She is most comfortable starting amitriptyline at 10 mg daily.  We have discussed potential side effects.  I provided additional education in her AVS.  We will discontinue Ajovy as this has not been effective.  She will continue Imitrex as needed for abortive therapy.  I have advised that she try not to use this unless absolutely necessary.  She will also avoid any other over-the-counter analgesics.  We have discussed rebound headaches.  She will follow-up with me via MyChart virtual visit in 3 months.  She verbalizes understanding and agreement with this plan.   No  orders of the defined types were placed in this encounter.    Meds ordered this encounter  Medications  . amitriptyline (ELAVIL) 10 MG tablet    Sig: Take 1 tablet (10 mg total) by mouth at bedtime.    Dispense:  30 tablet    Refill:  3    Order Specific Question:   Supervising Provider    Answer:   Melvenia Beam [id number]      I spent 15 minutes with the patient. 50% of this time was spent counseling and educating patient on plan of care and medications.    Debbora Presto, FNP-C 06/05/2019, 12:35 PM [facility] [address], [place], [place] [address] [phone]  Made any corrections needed, and agree with history, physical, neuro exam,assessment and plan as stated.     Sarina Ill, MD [facility]

## 2019-06-05 NOTE — Patient Instructions (Signed)
We will discontinue Ajovy. We will add amitriptyline 10mg  at bedtime.   Try to avoid Imitrex regularly.  Follow up in 3 months    Amitriptyline tablets What is this medicine? AMITRIPTYLINE (a mee TRIP ti leen) is used to treat depression. This medicine may be used for other purposes; ask your health care provider or pharmacist if you have questions. COMMON BRAND NAME(S): Elavil, Vanatrip What should I tell my health care provider before I take this medicine? They need to know if you have any of these conditions:  an alcohol problem  asthma, difficulty breathing  bipolar disorder or schizophrenia  difficulty passing urine, prostate trouble  glaucoma  heart disease or previous heart attack  liver disease  over active thyroid  seizures  thoughts or plans of suicide, a previous suicide attempt, or family history of suicide attempt  an unusual or allergic reaction to amitriptyline, other medicines, foods, dyes, or preservatives  pregnant or trying to get pregnant  breast-feeding How should I use this medicine? Take this medicine by mouth with a drink of water. Follow the directions on the prescription label. You can take the tablets with or without food. Take your medicine at regular intervals. Do not take it more often than directed. Do not stop taking this medicine suddenly except upon the advice of your doctor. Stopping this medicine too quickly may cause serious side effects or your condition may worsen. A special MedGuide will be given to you by the pharmacist with each prescription and refill. Be sure to read this information carefully each time. Talk to your pediatrician regarding the use of this medicine in children. Special care may be needed. Overdosage: If you think you have taken too much of this medicine contact a poison control center or emergency room at once. NOTE: This medicine is only for you. Do not share this medicine with others. What if I miss a  dose? If you miss a dose, take it as soon as you can. If it is almost time for your next dose, take only that dose. Do not take double or extra doses. What may interact with this medicine? Do not take this medicine with any of the following medications:  arsenic trioxide  certain medicines used to regulate abnormal heartbeat or to treat other heart conditions  cisapride  droperidol  halofantrine  linezolid  MAOIs like Carbex, Eldepryl, Marplan, Nardil, and Parnate  methylene blue  other medicines for mental depression  phenothiazines like perphenazine, thioridazine and chlorpromazine  pimozide  probucol  procarbazine  sparfloxacin  St. John's Wort This medicine may also interact with the following medications:  atropine and related drugs like hyoscyamine, scopolamine, tolterodine and others  barbiturate medicines for inducing sleep or treating seizures, like phenobarbital  cimetidine  disulfiram  ethchlorvynol  thyroid hormones such as levothyroxine  ziprasidone This list may not describe all possible interactions. Give your health care provider a list of all the medicines, herbs, non-prescription drugs, or dietary supplements you use. Also tell them if you smoke, drink alcohol, or use illegal drugs. Some items may interact with your medicine. What should I watch for while using this medicine? Tell your doctor if your symptoms do not get better or if they get worse. Visit your doctor or health care professional for regular checks on your progress. Because it may take several weeks to see the full effects of this medicine, it is important to continue your treatment as prescribed by your doctor. Patients and their families should watch out  for new or worsening thoughts of suicide or depression. Also watch out for sudden changes in feelings such as feeling anxious, agitated, panicky, irritable, hostile, aggressive, impulsive, severely restless, overly excited and  hyperactive, or not being able to sleep. If this happens, especially at the beginning of treatment or after a change in dose, call your health care professional. Dennis Bast may get drowsy or dizzy. Do not drive, use machinery, or do anything that needs mental alertness until you know how this medicine affects you. Do not stand or sit up quickly, especially if you are an older patient. This reduces the risk of dizzy or fainting spells. Alcohol may interfere with the effect of this medicine. Avoid alcoholic drinks. Do not treat yourself for coughs, colds, or allergies without asking your doctor or health care professional for advice. Some ingredients can increase possible side effects. Your mouth may get dry. Chewing sugarless gum or sucking hard candy, and drinking plenty of water will help. Contact your doctor if the problem does not go away or is severe. This medicine may cause dry eyes and blurred vision. If you wear contact lenses you may feel some discomfort. Lubricating drops may help. See your eye doctor if the problem does not go away or is severe. This medicine can cause constipation. Try to have a bowel movement at least every 2 to 3 days. If you do not have a bowel movement for 3 days, call your doctor or health care professional. This medicine can make you more sensitive to the sun. Keep out of the sun. If you cannot avoid being in the sun, wear protective clothing and use sunscreen. Do not use sun lamps or tanning beds/booths. What side effects may I notice from receiving this medicine? Side effects that you should report to your doctor or health care professional as soon as possible:  allergic reactions like skin rash, itching or hives, swelling of the face, lips, or tongue  anxious  breathing problems  changes in vision  confusion  elevated mood, decreased need for sleep, racing thoughts, impulsive behavior  eye pain  fast, irregular heartbeat  feeling faint or lightheaded,  falls  feeling agitated, angry, or irritable  fever with increased sweating  hallucination, loss of contact with reality  seizures  stiff muscles  suicidal thoughts or other mood changes  tingling, pain, or numbness in the feet or hands  trouble passing urine or change in the amount of urine  trouble sleeping  unusually weak or tired  vomiting  yellowing of the eyes or skin Side effects that usually do not require medical attention (report to your doctor or health care professional if they continue or are bothersome):  change in sex drive or performance  change in appetite or weight  constipation  dizziness  dry mouth  nausea  tired  tremors  upset stomach This list may not describe all possible side effects. Call your doctor for medical advice about side effects. You may report side effects to FDA at 1-800-FDA-1088. Where should I keep my medicine? Keep out of the reach of children. Store at room temperature between 20 and 25 degrees C (68 and 77 degrees F). Throw away any unused medicine after the expiration date. NOTE: This sheet is a summary. It may not cover all possible information. If you have questions about this medicine, talk to your doctor, pharmacist, or health care provider.  2020 Elsevier/Gold Standard (2018-05-01 13:04:32)   Migraine Headache A migraine headache is a very strong throbbing pain  on one side or both sides of your head. This type of headache can also cause other symptoms. It can last from 4 hours to 3 days. Talk with your doctor about what things may bring on (trigger) this condition. What are the causes? The exact cause of this condition is not known. This condition may be triggered or caused by:  Drinking alcohol.  Smoking.  Taking medicines, such as: ? Medicine used to treat chest pain (nitroglycerin). ? Birth control pills. ? Estrogen. ? Some blood pressure medicines.  Eating or drinking certain products.  Doing  physical activity. Other things that may trigger a migraine headache include:  Having a menstrual period.  Pregnancy.  Hunger.  Stress.  Not getting enough sleep or getting too much sleep.  Weather changes.  Tiredness (fatigue). What increases the risk?  Being 46-67 years old.  Being female.  Having a family history of migraine headaches.  Being Caucasian.  Having depression or anxiety.  Being very overweight. What are the signs or symptoms?  A throbbing pain. This pain may: ? Happen in any area of the head, such as on one side or both sides. ? Make it hard to do daily activities. ? Get worse with physical activity. ? Get worse around bright lights or loud noises.  Other symptoms may include: ? Feeling sick to your stomach (nauseous). ? Vomiting. ? Dizziness. ? Being sensitive to bright lights, loud noises, or smells.  Before you get a migraine headache, you may get warning signs (an aura). An aura may include: ? Seeing flashing lights or having blind spots. ? Seeing bright spots, halos, or zigzag lines. ? Having tunnel vision or blurred vision. ? Having numbness or a tingling feeling. ? Having trouble talking. ? Having weak muscles.  Some people have symptoms after a migraine headache (postdromal phase), such as: ? Tiredness. ? Trouble thinking (concentrating). How is this treated?  Taking medicines that: ? Relieve pain. ? Relieve the feeling of being sick to your stomach. ? Prevent migraine headaches.  Treatment may also include: ? Having acupuncture. ? Avoiding foods that bring on migraine headaches. ? Learning ways to control your body functions (biofeedback). ? Therapy to help you know and deal with negative thoughts (cognitive behavioral therapy). Follow these instructions at home: Medicines  Take over-the-counter and prescription medicines only as told by your doctor.  Ask your doctor if the medicine prescribed to you: ? Requires you to  avoid driving or using heavy machinery. ? Can cause trouble pooping (constipation). You may need to take these steps to prevent or treat trouble pooping:  Drink enough fluid to keep your pee (urine) pale yellow.  Take over-the-counter or prescription medicines.  Eat foods that are high in fiber. These include beans, whole grains, and fresh fruits and vegetables.  Limit foods that are high in fat and sugar. These include fried or sweet foods. Lifestyle  Do not drink alcohol.  Do not use any products that contain nicotine or tobacco, such as cigarettes, e-cigarettes, and chewing tobacco. If you need help quitting, ask your doctor.  Get at least 8 hours of sleep every night.  Limit and deal with stress. General instructions      Keep a journal to find out what may bring on your migraine headaches. For example, write down: ? What you eat and drink. ? How much sleep you get. ? Any change in what you eat or drink. ? Any change in your medicines.  If you have a migraine  headache: ? Avoid things that make your symptoms worse, such as bright lights. ? It may help to lie down in a dark, quiet room. ? Do not drive or use heavy machinery. ? Ask your doctor what activities are safe for you.  Keep all follow-up visits as told by your doctor. This is important. Contact a doctor if:  You get a migraine headache that is different or worse than others you have had.  You have more than 15 headache days in one month. Get help right away if:  Your migraine headache gets very bad.  Your migraine headache lasts longer than 72 hours.  You have a fever.  You have a stiff neck.  You have trouble seeing.  Your muscles feel weak or like you cannot control them.  You start to lose your balance a lot.  You start to have trouble walking.  You pass out (faint).  You have a seizure. Summary  A migraine headache is a very strong throbbing pain on one side or both sides of your head.  These headaches can also cause other symptoms.  This condition may be treated with medicines and changes to your lifestyle.  Keep a journal to find out what may bring on your migraine headaches.  Contact a doctor if you get a migraine headache that is different or worse than others you have had.  Contact your doctor if you have more than 15 headache days in a month. This information is not intended to replace advice given to you by your health care provider. Make sure you discuss any questions you have with your health care provider. Document Revised: 08/31/2018 Document Reviewed: 06/21/2018 Elsevier Patient Education  [place].

## 2019-06-08 IMAGING — MG MM BREAST LOCALIZATION CLIP
4 series · 4 of 12 positions shown · non-contrast
Comparison: Previous exam(s).

CLINICAL DATA: Status post stereotactic guided core biopsy of
distortion in the UPPER-OUTER QUADRANT of the RIGHT breast.

EXAM:
DIAGNOSTIC RIGHT MAMMOGRAM POST STEREOTACTIC BIOPSY

[R ML synth-2D]
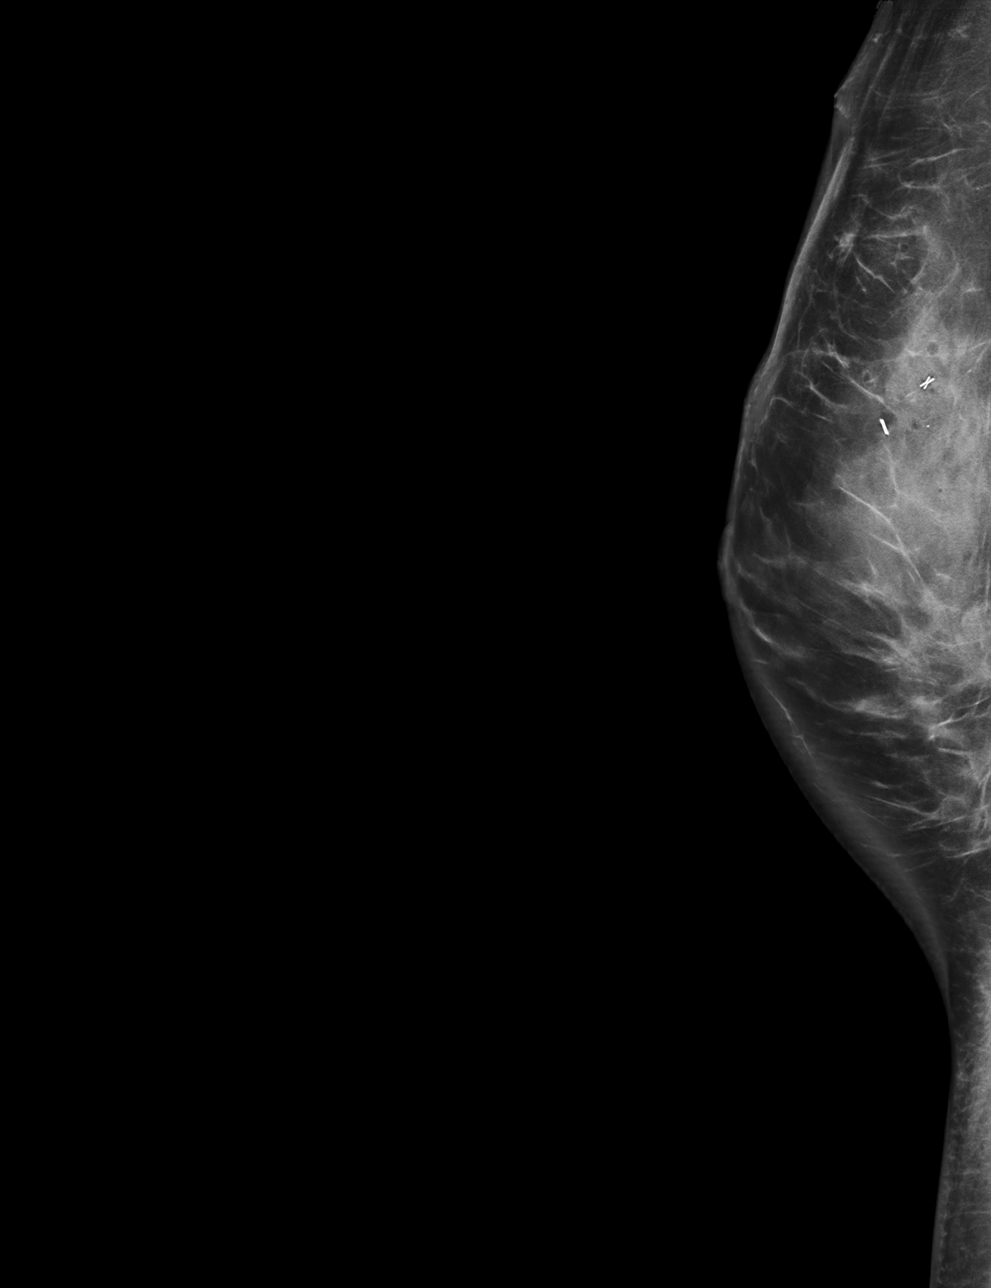

[R CC synth-2D]
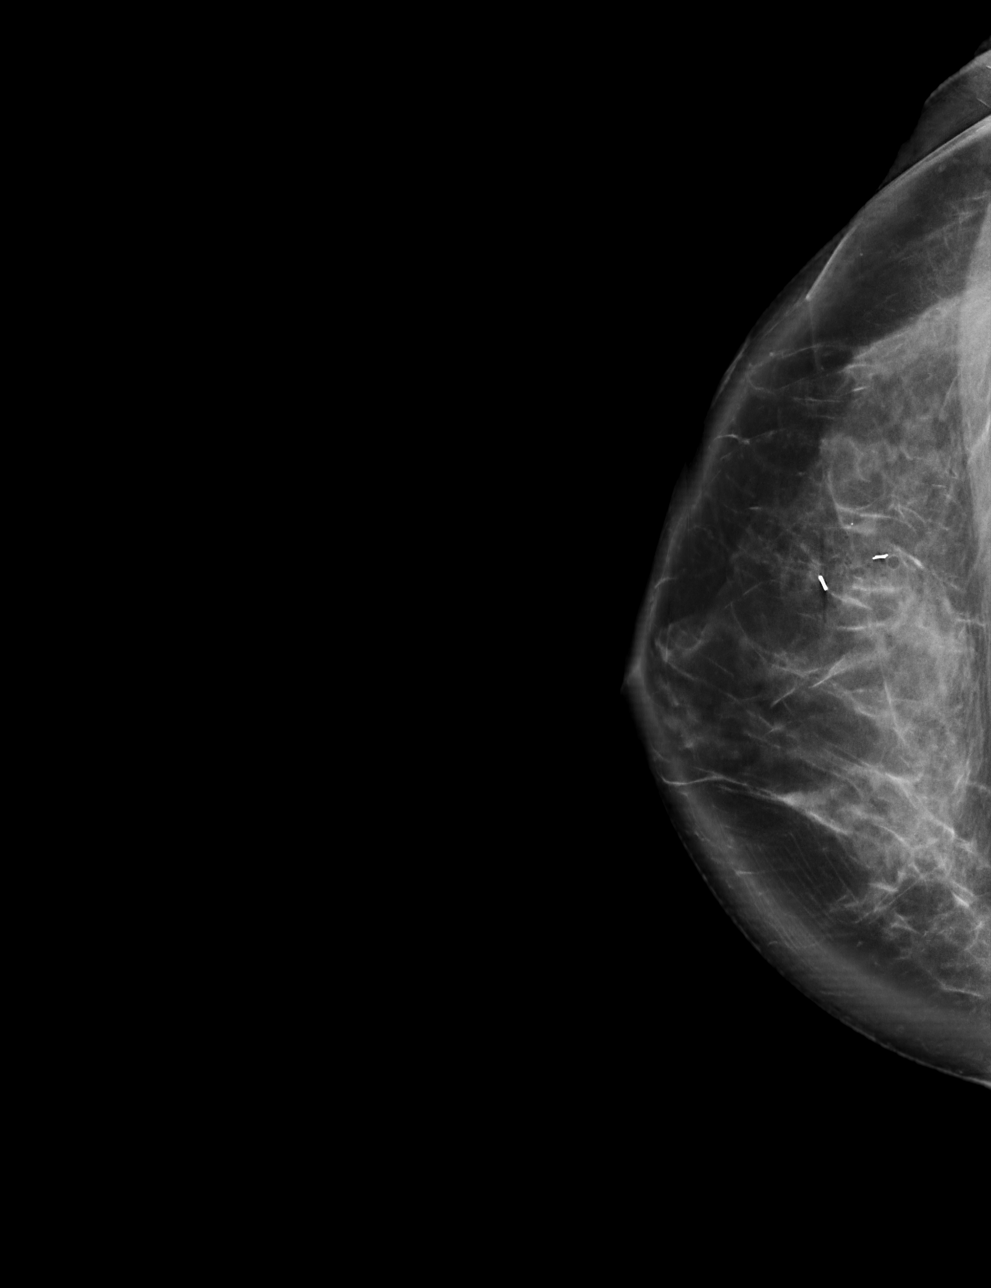

[R CC tomo · tomo slice 47/92.0]
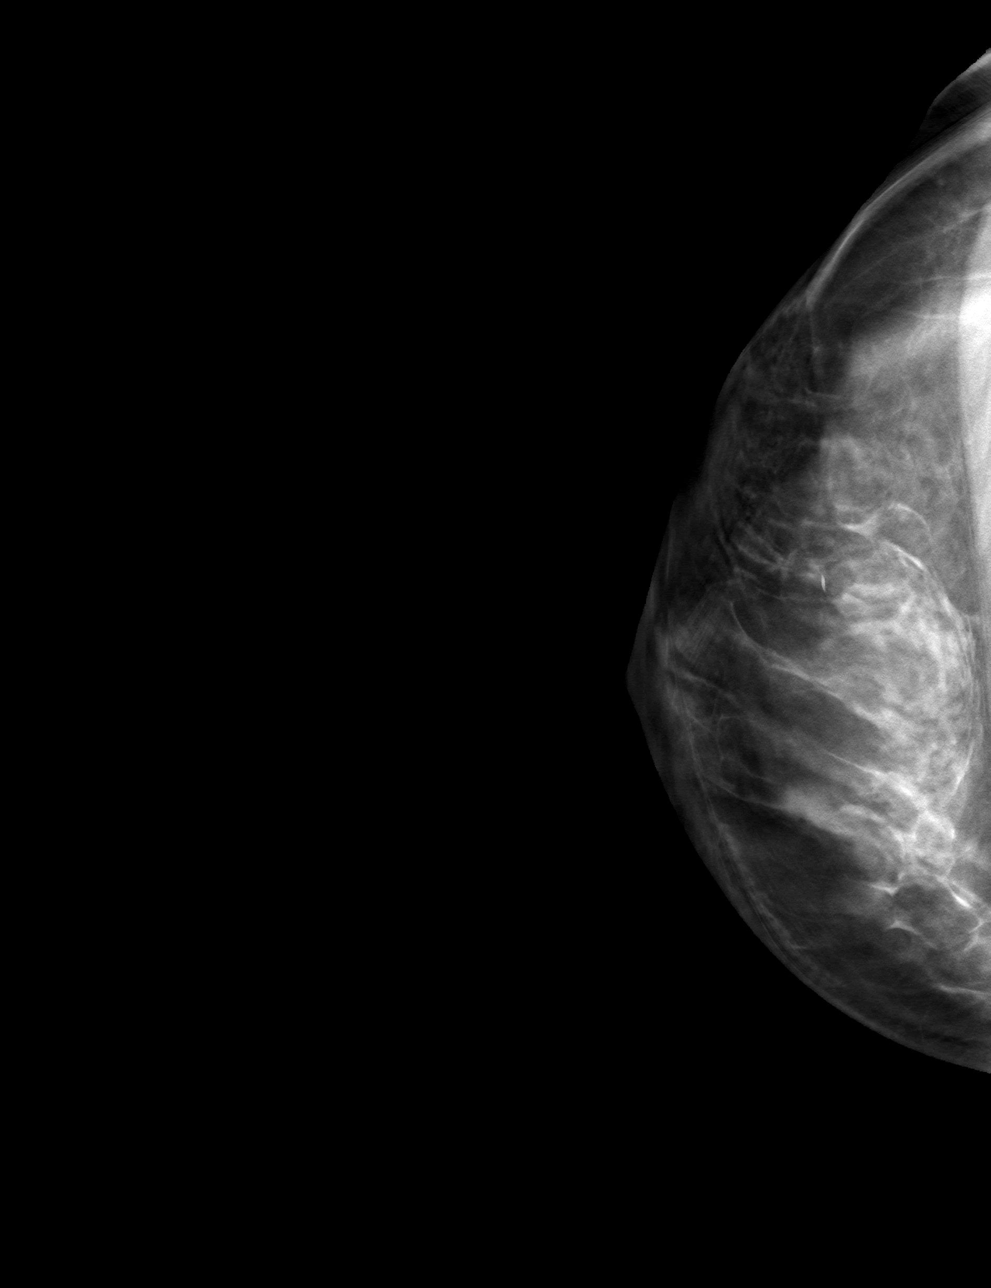

[R ML tomo · tomo slice 46/91.0]
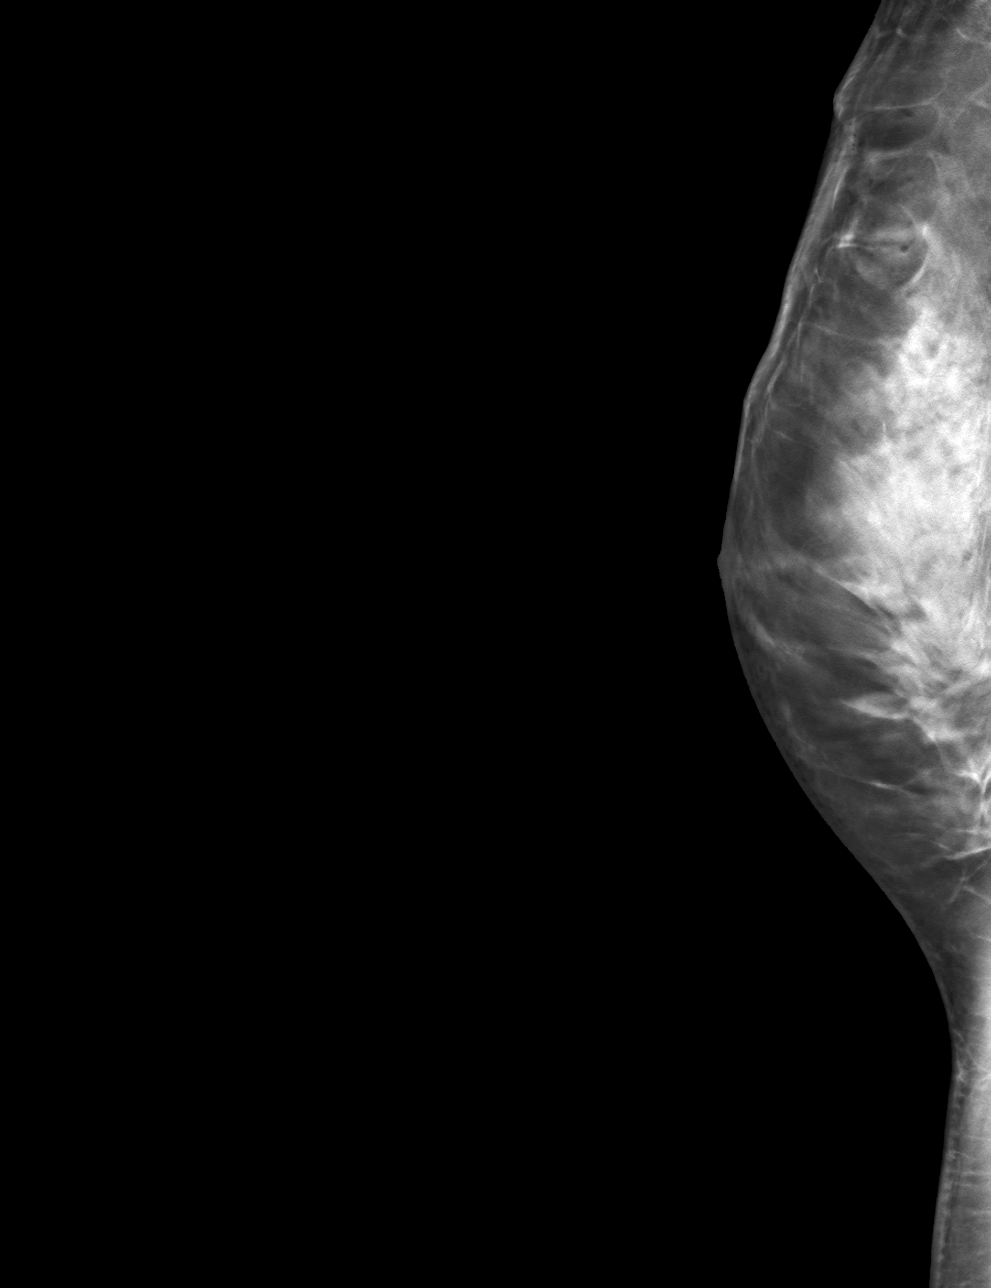

[4 of 12 positions shown; findings below may reference images not displayed]

FINDINGS: Mammographic images were obtained following stereotactic guided
biopsy of distortion in the UPPER-OUTER QUADRANT of the RIGHT breast
and placement of an X shaped clip. The clip is identified within the
area concern in the UPPER-OUTER QUADRANT of the RIGHT breast. A
cylinder-shaped clip is identified just anterior to this lesion from
a remote stereotactic biopsy site.
IMPRESSION: Tissue marker clip in the expected location following biopsy.

Final Assessment: Post Procedure Mammograms for Marker Placement

## 2019-06-08 IMAGING — MG STEREOTACTIC VACUUM ASSIST RIGHT
7 of 12 series · 7 of 24 positions shown · non-contrast
Comparison: Previous exams.
COMPARISON: Previous exams.

Addendum:
CLINICAL DATA: Patient presents for stereotactic guided core biopsy
of distortion in the of the RIGHT.

EXAM:
RIGHT BREAST STEREOTACTIC CORE NEEDLE BIOPSY

[R (1 of 7)]
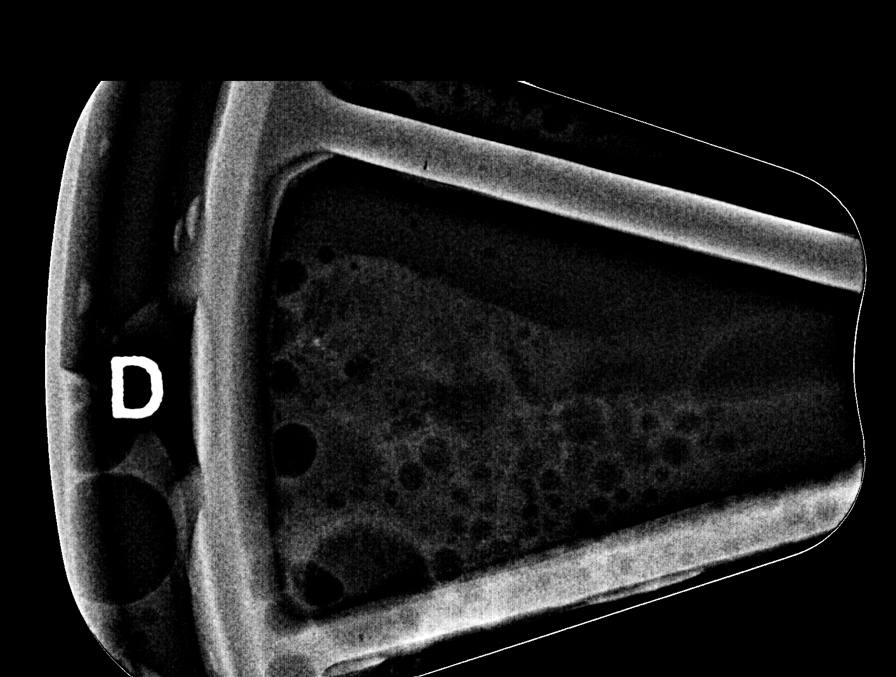

[R (2 of 7)]
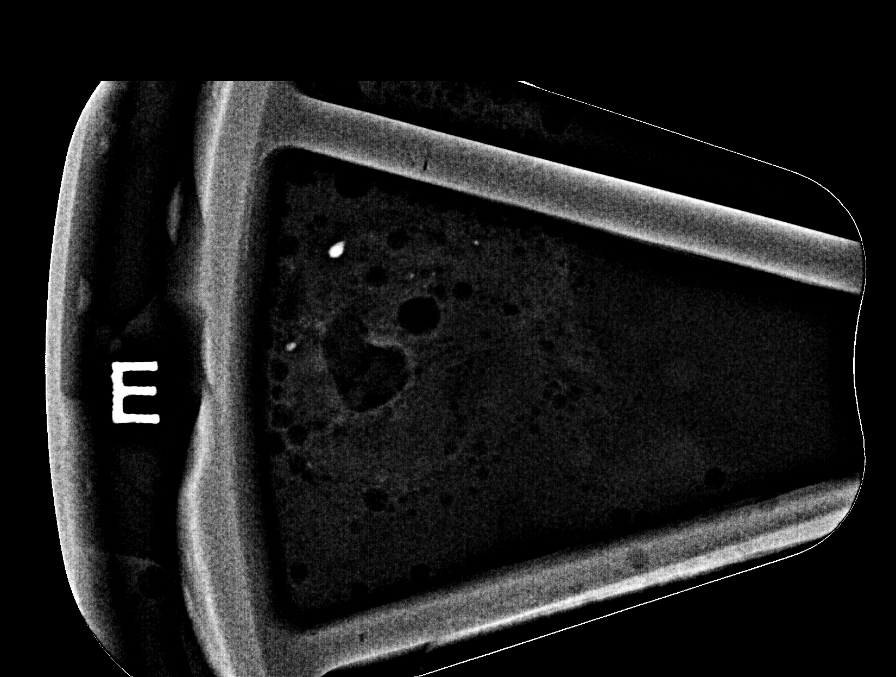

[R (3 of 7)]
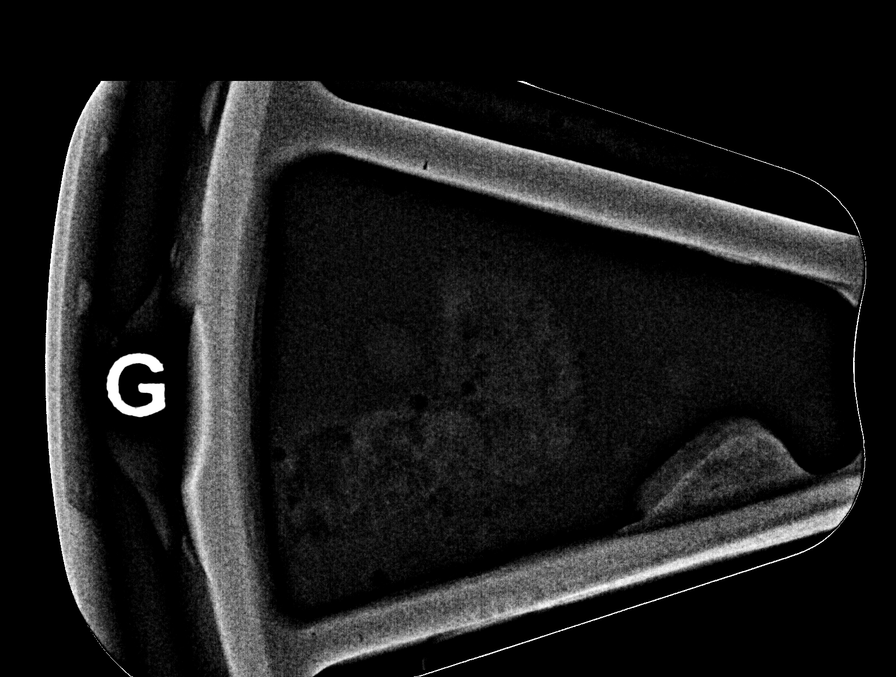

[R (4 of 7)]
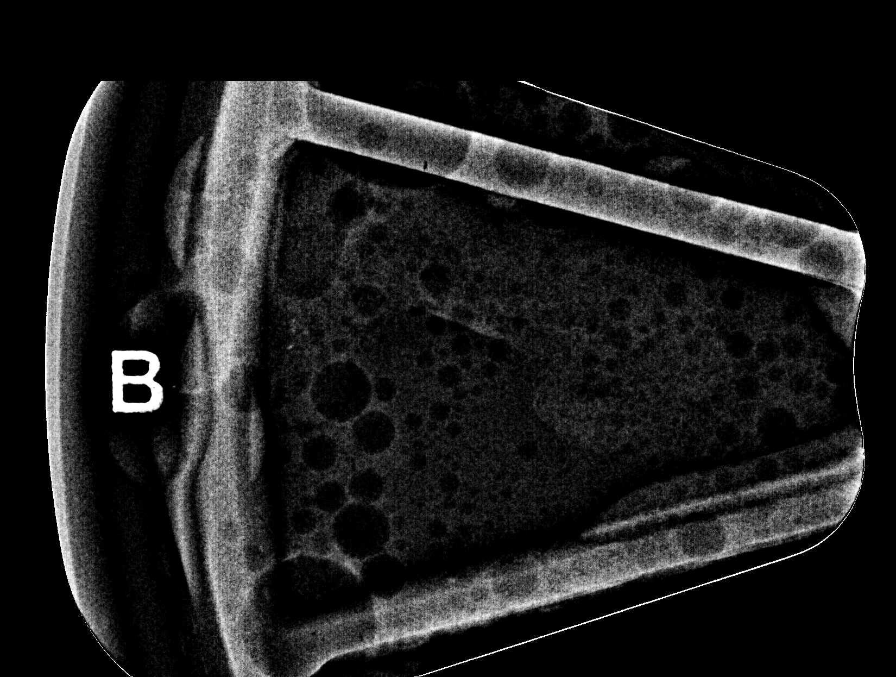

[R (5 of 7)]
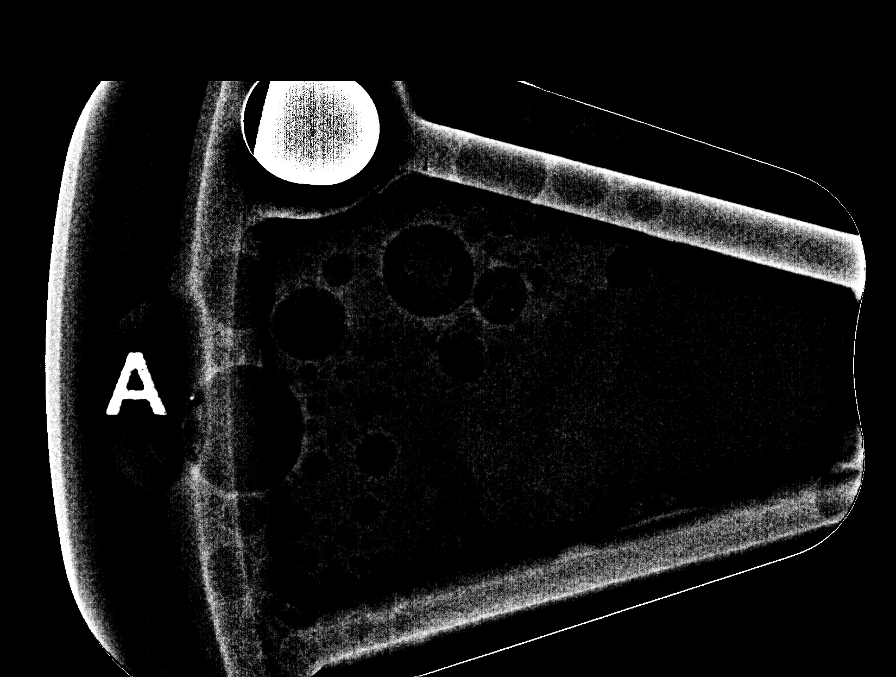

[R (6 of 7)]
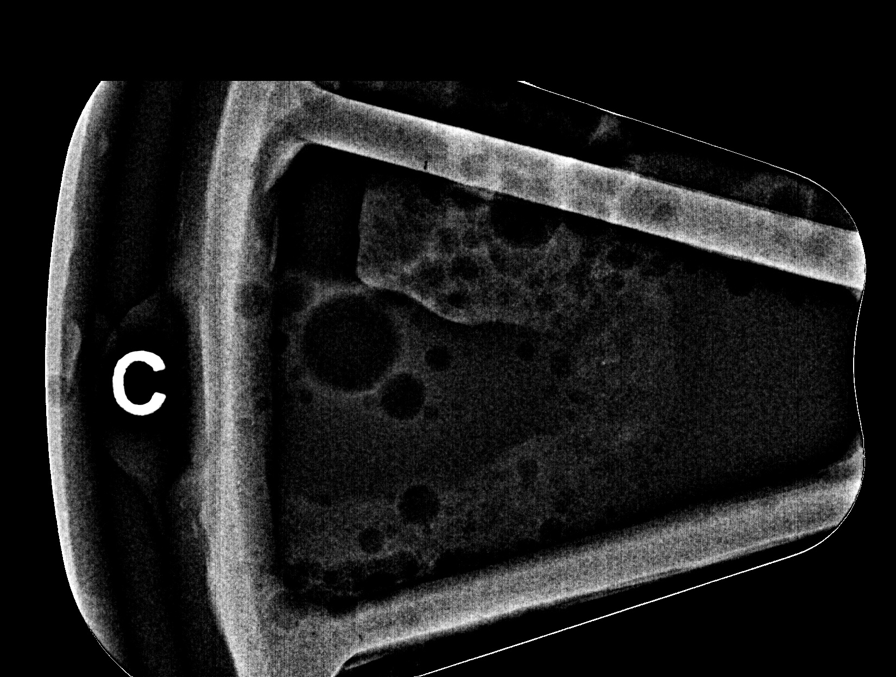

[R (7 of 7)]
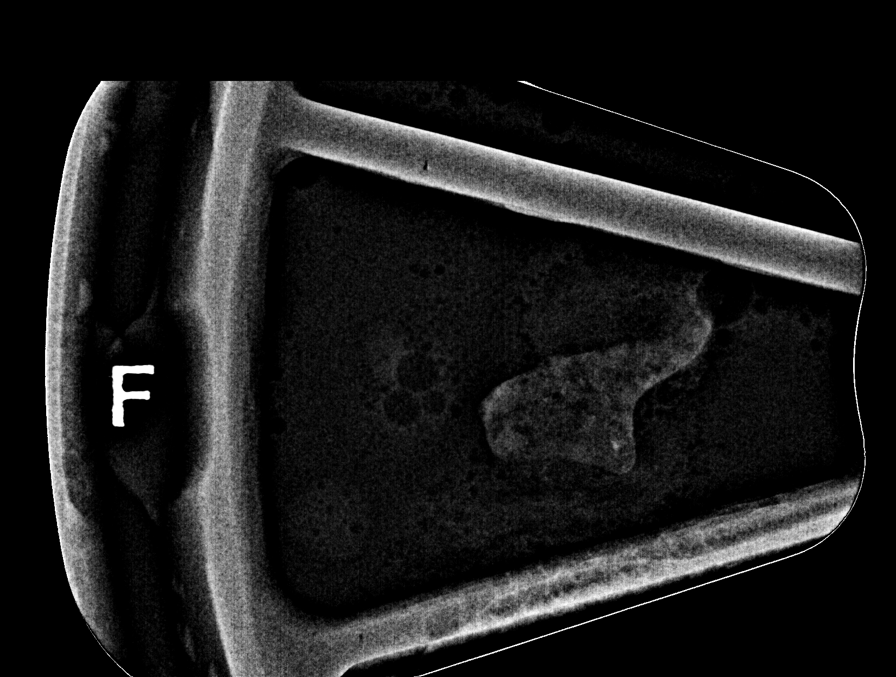

[7 of 24 positions shown; findings below may reference images not displayed]



Using sterile technique and 1% Lidocaine as local anesthetic, under
stereotactic guidance, a 9 gauge vacuum assisted device was used to
perform core needle biopsy of distortion in the UPPER-OUTER QUADRANT
of the RIGHT breast using a superior to inferior approach. Specimen
radiograph was performed showing calcifications adjacent to
distortion are present in the specimen samples. Specimens with
calcifications are identified for pathology.

Lesion quadrant: UPPER-OUTER QUADRANT RIGHT breast

At the conclusion of the procedure, an X tissue marker clip was
deployed into the biopsy cavity. Follow-up 2-view mammogram was
performed and dictated separately.
IMPRESSION: Stereotactic-guided biopsy of RIGHT breast distortion an adjacent
calcifications. No apparent complications.

ADDENDUM:
Pathology of the RIGHT breast biopsy revealed A. BREAST, RIGHT UPPER
OUTER; STEREOTACTIC BIOPSY: BENIGN BREAST TISSUE WITH STROMAL
FIBROSIS, RUPTURED DUCT ECTASIA, SCLEROSING ADENOSIS, MICROCYST
FORMATION, FIBROADENOMATOID CHANGE, AND APOCRINE METAPLASIA. FOCAL
CHANGES CONSISTENT WITH PRIOR BIOPSY SITE. SEE COMMENT.
CALCIFICATION ASSOCIATED WITH SCLEROSING ADENOSIS, ATROPHIC MAMMARY

EPITHELIUM, AND STROMAL FIBROSIS. NEGATIVE FOR ATYPIA AND
MALIGNANCY. Comment: The differential diagnosis for the above
findings includes a complex sclerosing lesion and fibrocystic
changes with superimposed reactive changes secondary to prior biopsy
in this area. The former is favored. Correlation with clinical
impression and radiographic findings is required.

This was found to be concordant by Dr. Delcic Catareos.

Recommendation: Surgical excision.

At the patient's request, results and recommendations were relayed
to the patient by phone by Rezeki, Kaichiu on 07/27/2018. The
patient stated she did well following the biopsy with no bleeding or
pain. Post biopsy instructions were reviewed with the patient and
all of her questions were answered. She was encouraged to contact
the [HOSPITAL] with any further questions or concerns.
The breast navigator will contact the patient to arrange the
referral. Request for referral was relayed to Illustrisimo, Eunice Joy on 07/27/2018.

Addendum by Rezeki, Kaichiu on 08/01/2018.



Using sterile technique and 1% Lidocaine as local anesthetic, under
stereotactic guidance, a 9 gauge vacuum assisted device was used to
perform core needle biopsy of distortion in the UPPER-OUTER QUADRANT
of the RIGHT breast using a superior to inferior approach. Specimen
radiograph was performed showing calcifications adjacent to
distortion are present in the specimen samples. Specimens with
calcifications are identified for pathology.

Lesion quadrant: UPPER-OUTER QUADRANT RIGHT breast

At the conclusion of the procedure, an X tissue marker clip was
deployed into the biopsy cavity. Follow-up 2-view mammogram was
performed and dictated separately.
IMPRESSION: Stereotactic-guided biopsy of RIGHT breast distortion an adjacent
calcifications. No apparent complications.

## 2019-06-10 ENCOUNTER — Other Ambulatory Visit: Payer: Self-pay

## 2019-06-10 DIAGNOSIS — R928 Other abnormal and inconclusive findings on diagnostic imaging of breast: Secondary | ICD-10-CM

## 2019-07-02 DIAGNOSIS — G43709 Chronic migraine without aura, not intractable, without status migrainosus: Secondary | ICD-10-CM

## 2019-07-02 MED ORDER — AMITRIPTYLINE HCL 10 MG PO TABS: 10.0000 mg | ORAL_TABLET | Freq: Every day | ORAL | 0 refills | Status: DC

## 2019-07-02 NOTE — Addendum Note (Signed)
Addended by: Gildardo Griffes on: 07/02/2019 02:29 PM   Modules accepted: Orders

## 2019-07-19 ENCOUNTER — Ambulatory Visit
Admission: RE | Admit: 2019-07-19 | Discharge: 2019-07-19 | Disposition: A | Discharge status: Home / Self Care | Payer: BC Managed Care – PPO | Source: Ambulatory Visit | Attending: Surgery | Admitting: Surgery

## 2019-07-19 DIAGNOSIS — R928 Other abnormal and inconclusive findings on diagnostic imaging of breast: Secondary | ICD-10-CM

## 2019-07-19 DIAGNOSIS — R922 Inconclusive mammogram: Secondary | ICD-10-CM | POA: Diagnosis not present

## 2019-07-22 ENCOUNTER — Telehealth: Payer: Self-pay | Admitting: Emergency Medicine

## 2019-07-22 DIAGNOSIS — G43909 Migraine, unspecified, not intractable, without status migrainosus: Secondary | ICD-10-CM

## 2019-07-22 MED ORDER — SUMATRIPTAN SUCCINATE 50 MG PO TABS: ORAL_TABLET | ORAL | 2 refills | Status: DC

## 2019-07-22 NOTE — Telephone Encounter (Signed)
Called pt to give mammo results and f/u appt with Dr Dahlia Byes with no answer. Left vm to call the clinic back.

## 2019-07-22 NOTE — Telephone Encounter (Signed)
-----   Message from Jules Husbands, MD sent at 07/19/2019  5:22 PM EST ----- Please let her know that Mammo showed asymmetry that is stable. May f/u w me ----- Message ----- From: Interface, Rad Results In Sent: 07/19/2019  12:09 PM EST To: Jules Husbands, MD

## 2019-07-22 NOTE — Addendum Note (Signed)
Addended by: Gildardo Griffes on: 07/22/2019 03:05 PM   Modules accepted: Orders

## 2019-07-24 ENCOUNTER — Ambulatory Visit: Payer: Self-pay | Admitting: Surgery

## 2019-09-03 ENCOUNTER — Telehealth (INDEPENDENT_AMBULATORY_CARE_PROVIDER_SITE_OTHER): Payer: BC Managed Care – PPO | Admitting: Family Medicine

## 2019-09-03 ENCOUNTER — Encounter: Payer: Self-pay | Admitting: Family Medicine

## 2019-09-03 DIAGNOSIS — G43909 Migraine, unspecified, not intractable, without status migrainosus: Secondary | ICD-10-CM

## 2019-09-03 DIAGNOSIS — G43709 Chronic migraine without aura, not intractable, without status migrainosus: Secondary | ICD-10-CM | POA: Diagnosis not present

## 2019-09-03 MED ORDER — AMITRIPTYLINE HCL 10 MG PO TABS: 10.0000 mg | ORAL_TABLET | Freq: Every day | ORAL | 3 refills | Status: DC

## 2019-09-03 MED ORDER — SUMATRIPTAN SUCCINATE 50 MG PO TABS: ORAL_TABLET | ORAL | 11 refills | Status: DC

## 2019-09-03 NOTE — Progress Notes (Addendum)
PATIENT: Veronica Henderson DOB: [birth year]  REASON FOR VISIT: follow up HISTORY FROM: patient  Virtual Visit via Telephone Note  I connected with Veronica Henderson on 09/03/19 at  9:00 AM EDT by telephone and verified that I am speaking with the correct person using two identifiers.   I discussed the limitations, risks, security and privacy concerns of performing an evaluation and management service by telephone and the availability of in person appointments. I also discussed with the patient that there may be a patient responsible charge related to this service. The patient expressed understanding and agreed to proceed.   History of Present Illness:  09/03/19 Veronica Henderson is a 54 y.o. female here today for follow up of headaches/migraines. She was started on amitriptyline 10mg  at bedtime in 05/2018. She reports that migraine frequency has reduced. She may have 5 migraines per month that are easily aborted with sumatriptan. She is tolerating medications well, although, she does note sleepiness with amitriptyline. She usually takes medication around 7:30 pm and feels side effects are mild. She is resting better.    History (copied from my note on 06/05/2019)  Veronica Henderson is a 54 y.o. female here today for follow up for migraines. She was started on Ajovy and Imitrex. She is unsure if headaches have improved. She has taken 4 Ajovy injections. She does not feel that headaches have changed at all. Pain is usually unilateral, retro orbital but sometimes starts in the back of her head. She gets sound sensitivity and nausea. No sensitivity to light. She has not identified triggers. She does feel that she gets anxious about headaches but is generally not an anxious person. She sleeps fairly well. No snoring. Usually 7-8 hours a night.   HISTORY: (copied from Dr Cathren Laine note on 02/25/2019)  [name] a 54 y.o.femalehere as requested by McLean-Scocuzza, Olivia Mackie *for migraines. Past medical  history migraines since childhood. She has a family history in her sister and maternal grandmother. She has had headaches since a child. She knows when the headache is coming and if she doesn't have imitrex it can last days. No aura. o vision changes. It is in the right eye, behind the eye, can spread to the other side, severe, pounding pulsating, severe, no aura. Last week she had a migraine 4 days in a row. She took 4 imitrex pills last week. She gets anxious thinking about getting a headache. She is having migraines more frequently. Beer can trigger. The migraines can rebound back. She has 10 headache days a month. OTC doesn't work. She has new pain in the occipital area, and she can wake up the migraines.No other focal neurologic deficits, associated symptoms, inciting events or modifiable factors.  No other focal neurologic deficits, associated symptoms, inciting events or modifiable factors.  Reviewed notes, labs and imaging from outside physicians, which showed:  CBC with low white blood cells 3.5 otherwise normal, February 2020. CMP was normal in January 2020.  I reviewedMcLean-Scocuzza, Nino Glow, MD's notes.Patient was seen in January of this year, she has had chronic migraines since a kid lasting 3 days, using 18 Imitrex daily over-the-counter medications. She feels increased anxiety before the headache mostly they are in the right eye. She has nausea vomiting, phonophobia, no significant photophobia, she is on ophthalmologist and per patient her eyes were okay, 2 to 3 cups of coffee a day, her sister and maternal grandmother had migraines which stopped at an older age 80s. Sleeping 7 to 8 hours a night. She followed up  with physician again in February of this year and followed for leukopenia.   Observations/Objective:  Generalized: Well developed, in no acute distress  Mentation: Alert oriented to time, place, history taking. Follows all commands speech and language  fluent   Assessment and Plan:  54 y.o. year old female  has a past medical history of Leukopenia and Migraines. here with    ICD-10-CM   1. Chronic migraine without aura without status migrainosus, not intractable  G43.709 amitriptyline (ELAVIL) 10 MG tablet  2. Migraine without status migrainosus, not intractable, unspecified migraine type  G43.909 SUMAtriptan (IMITREX) 50 MG tablet    Karrah has tolerated amitriptyline 10mg  at bedtime and feels that migraine frequency has reduced. She may have 4-5 migraines per month easily aborted with sumatriptan. We will continue current treatment plan. We have discussed increasing amitriptyline dose to 20mg  in future if needed but will have to monitor for increased sleepiness. She will continue healthy lifestyle habits with adequate hydration, well balanced diet and regular exercise. She will follow up with me in 1 year, sooner if needed. She verbalizes understanding and agreement with this plan.   No orders of the defined types were placed in this encounter.   Meds ordered this encounter  Medications  . amitriptyline (ELAVIL) 10 MG tablet    Sig: Take 1 tablet (10 mg total) by mouth at bedtime.    Dispense:  90 tablet    Refill:  3    Cancel previous amitriptyline Rx. Sending 90 day supply.    Order Specific Question:   Supervising Provider    Answer:   Melvenia Beam [id number]  . SUMAtriptan (IMITREX) 50 MG tablet    Sig: TAKE ONE TABLET BY MOUTH FOR HEADACHE prn , CAN REPEAT IN 2 HOURS if needed, max 100 mg per day.    Dispense:  10 tablet    Refill:  11    10 tablets per month.    Order Specific Question:   Supervising Provider    Answer:   Melvenia Beam [id number]     Follow Up Instructions:  I discussed the assessment and treatment plan with the patient. The patient was provided an opportunity to ask questions and all were answered. The patient agreed with the plan and demonstrated an understanding of the instructions.   The  patient was advised to call back or seek an in-person evaluation if the symptoms worsen or if the condition fails to improve as anticipated.  I provided 15 minutes of non-face-to-face time during this encounter.  Patient is located at her place of residence during my chart visit.  Provider is in the office.   Debbora Presto, NP   Made any corrections needed, and agree with history, physical, neuro exam,assessment and plan as stated.     Sarina Ill, MD [facility]

## 2019-12-11 ENCOUNTER — Telehealth: Payer: Self-pay | Admitting: *Deleted

## 2019-12-11 NOTE — Telephone Encounter (Signed)
Received my chart [facility]. I replied and advised she call [facility] [address] where refills were sent in April x 1 year. [facility]aged [facility]'t [facility]. Called [facility] [address], spoke with Karena Addison who stated the patient needs to [facility] and they can pull Rx [facility] their [facility], both sumatriptan and amitriptyline. I sent my chart advising patient of above conversation.

## 2020-02-27 ENCOUNTER — Encounter: Payer: Self-pay | Admitting: Internal Medicine

## 2020-03-12 ENCOUNTER — Other Ambulatory Visit: Payer: Self-pay

## 2020-03-12 DIAGNOSIS — G43709 Chronic migraine without aura, not intractable, without status migrainosus: Secondary | ICD-10-CM

## 2020-03-12 MED ORDER — AMITRIPTYLINE HCL 10 MG PO TABS: 10.0000 mg | ORAL_TABLET | Freq: Every day | ORAL | 3 refills | Status: DC

## 2020-05-13 ENCOUNTER — Other Ambulatory Visit: Payer: Self-pay | Admitting: Family Medicine

## 2020-05-13 DIAGNOSIS — G43709 Chronic migraine without aura, not intractable, without status migrainosus: Secondary | ICD-10-CM

## 2020-08-11 ENCOUNTER — Encounter: Payer: Self-pay | Admitting: Internal Medicine

## 2020-08-12 ENCOUNTER — Other Ambulatory Visit: Payer: Self-pay | Admitting: Internal Medicine

## 2020-08-12 DIAGNOSIS — R928 Other abnormal and inconclusive findings on diagnostic imaging of breast: Secondary | ICD-10-CM

## 2020-08-12 DIAGNOSIS — Z1231 Encounter for screening mammogram for malignant neoplasm of breast: Secondary | ICD-10-CM

## 2020-08-20 ENCOUNTER — Encounter: Payer: Self-pay | Admitting: Internal Medicine

## 2020-08-20 ENCOUNTER — Encounter: Payer: Self-pay | Admitting: Family Medicine

## 2020-08-20 ENCOUNTER — Other Ambulatory Visit: Payer: Self-pay

## 2020-08-20 ENCOUNTER — Ambulatory Visit (INDEPENDENT_AMBULATORY_CARE_PROVIDER_SITE_OTHER): Payer: BC Managed Care – PPO | Admitting: Internal Medicine

## 2020-08-20 VITALS — BP 122/70 | HR 64 | Ht 66.1 in | Wt 139.8 lb

## 2020-08-20 DIAGNOSIS — D649 Anemia, unspecified: Secondary | ICD-10-CM

## 2020-08-20 DIAGNOSIS — R7989 Other specified abnormal findings of blood chemistry: Secondary | ICD-10-CM | POA: Insufficient documentation

## 2020-08-20 DIAGNOSIS — G43911 Migraine, unspecified, intractable, with status migrainosus: Secondary | ICD-10-CM

## 2020-08-20 DIAGNOSIS — Z1389 Encounter for screening for other disorder: Secondary | ICD-10-CM

## 2020-08-20 DIAGNOSIS — E611 Iron deficiency: Secondary | ICD-10-CM | POA: Diagnosis not present

## 2020-08-20 DIAGNOSIS — Z0000 Encounter for general adult medical examination without abnormal findings: Secondary | ICD-10-CM | POA: Diagnosis not present

## 2020-08-20 DIAGNOSIS — D72819 Decreased white blood cell count, unspecified: Secondary | ICD-10-CM

## 2020-08-20 DIAGNOSIS — Z13220 Encounter for screening for lipoid disorders: Secondary | ICD-10-CM

## 2020-08-20 DIAGNOSIS — Z1283 Encounter for screening for malignant neoplasm of skin: Secondary | ICD-10-CM

## 2020-08-20 DIAGNOSIS — R946 Abnormal results of thyroid function studies: Secondary | ICD-10-CM

## 2020-08-20 DIAGNOSIS — Z1329 Encounter for screening for other suspected endocrine disorder: Secondary | ICD-10-CM | POA: Diagnosis not present

## 2020-08-20 DIAGNOSIS — E538 Deficiency of other specified B group vitamins: Secondary | ICD-10-CM | POA: Diagnosis not present

## 2020-08-20 DIAGNOSIS — D72821 Monocytosis (symptomatic): Secondary | ICD-10-CM

## 2020-08-20 DIAGNOSIS — L989 Disorder of the skin and subcutaneous tissue, unspecified: Secondary | ICD-10-CM

## 2020-08-20 LAB — CBC WITH DIFFERENTIAL/PLATELET
Basophils Absolute: 0 10*3/uL (ref 0.0–0.1)
Basophils Relative: 0.6 % (ref 0.0–3.0)
Eosinophils Absolute: 0 10*3/uL (ref 0.0–0.7)
Eosinophils Relative: 0.7 % (ref 0.0–5.0)
HCT: 39.8 % (ref 36.0–46.0)
Hemoglobin: 13.5 g/dL (ref 12.0–15.0)
Lymphocytes Relative: 37.5 % (ref 12.0–46.0)
Lymphs Abs: 1.3 10*3/uL (ref 0.7–4.0)
MCHC: 33.8 g/dL (ref 30.0–36.0)
MCV: 95.7 fl (ref 78.0–100.0)
Monocytes Absolute: 0.5 10*3/uL (ref 0.1–1.0)
Monocytes Relative: 13.7 % — ABNORMAL HIGH (ref 3.0–12.0)
Neutro Abs: 1.6 10*3/uL (ref 1.4–7.7)
Neutrophils Relative %: 47.5 % (ref 43.0–77.0)
Platelets: 276 10*3/uL (ref 150.0–400.0)
RBC: 4.16 Mil/uL (ref 3.87–5.11)
RDW: 14.3 % (ref 11.5–15.5)
WBC: 3.4 10*3/uL — ABNORMAL LOW (ref 4.0–10.5)

## 2020-08-20 LAB — VITAMIN B12: Vitamin B-12: 87 pg/mL — ABNORMAL LOW (ref 211–911)

## 2020-08-20 LAB — COMPREHENSIVE METABOLIC PANEL
ALT: 15 U/L (ref 0–35)
AST: 25 U/L (ref 0–37)
Albumin: 4.3 g/dL (ref 3.5–5.2)
Alkaline Phosphatase: 85 U/L (ref 39–117)
BUN: 9 mg/dL (ref 6–23)
CO2: 29 mEq/L (ref 19–32)
Calcium: 9.6 mg/dL (ref 8.4–10.5)
Chloride: 103 mEq/L (ref 96–112)
Creatinine, Ser: 0.61 mg/dL (ref 0.40–1.20)
GFR: 101.09 mL/min (ref >60.00)
Glucose, Bld: 93 mg/dL (ref 70–99)
Potassium: 4.4 mEq/L (ref 3.5–5.1)
Sodium: 138 mEq/L (ref 135–145)
Total Bilirubin: 0.6 mg/dL (ref 0.2–1.2)
Total Protein: 7.1 g/dL (ref 6.0–8.3)

## 2020-08-20 LAB — TSH: TSH: 4.8 u[IU]/mL — ABNORMAL HIGH (ref 0.35–4.50)

## 2020-08-20 LAB — LIPID PANEL
Cholesterol: 184 mg/dL (ref 0–200)
HDL: 64.8 mg/dL (ref >39.00)
LDL Cholesterol: 106 mg/dL — ABNORMAL HIGH (ref 0–99)
NonHDL: 119.59
Total CHOL/HDL Ratio: 3
Triglycerides: 66 mg/dL (ref 0.0–149.0)
VLDL: 13.2 mg/dL (ref 0.0–40.0)

## 2020-08-20 NOTE — Progress Notes (Signed)
Chief Complaint  Patient presents with  . Annual Exam   Annual doing well  1. covid + 02/2020  2.left upper cheek below eye lesion new w/o sx's will refer to dermatology    Review of Systems  Constitutional: Negative for weight loss.  HENT: Negative for hearing loss.   Eyes: Negative for blurred vision.  Respiratory: Negative for shortness of breath.   Cardiovascular: Negative for chest pain.  Gastrointestinal: Negative for abdominal pain.  Musculoskeletal: Negative for falls and joint pain.  Skin: Negative for rash.  Neurological: Positive for headaches.  Psychiatric/Behavioral: Negative for depression.   Past Medical History:  Diagnosis Date  . COVID-19    02/26/20 fatigue x 5 days lost taste and smell   . Leukopenia   . Migraines    since childhood   Past Surgical History:  Procedure Laterality Date  . BREAST BIOPSY Right 2014   stereo- neg  . BREAST BIOPSY Right 07/26/2018   distortion bx with affirm, x marker, Complex sclerosing lesion, Stromal Fibrosis and Sclerosing Adenosis  . COLONOSCOPY WITH PROPOFOL N/A 08/01/2018   Procedure: COLONOSCOPY WITH PROPOFOL;  Surgeon: Virgel Manifold, MD;  Location: [facility] ENDOSCOPY;  Service: Endoscopy;  Laterality: N/A;  . NO PAST SURGERIES     Family History  Problem Relation Age of Onset  . Heart disease Mother   . Heart disease Father        mi  . Hyperlipidemia Father   . Hypertension Father   . AAA (abdominal aortic aneurysm) Father        ruptured died 7  . Cancer Sister        triple neg breast  . Breast cancer Sister 71  . Migraines Sister   . Stroke Maternal Grandmother   . Headache Maternal Grandmother   . Cancer Sister        cervical age 64 y.o   . Ankylosing spondylitis Sister   . Other Nephew        GIST tumor    Social History   Socioeconomic History  . Marital status: Married    Spouse name: Not on file  . Number of children: Not on file  . 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 Highest education level: High school graduate  Occupational History  . Not on file  Tobacco Use  . Smoking status: Never Smoker  . Smokeless tobacco: Never Used  Vaping Use  . Vaping Use: Never used  Substance and Sexual Activity  . Alcohol use: Yes    Alcohol/week: 1.0 - 2.0 standard drink    Types: 1 - 2 Cans of beer per week  . Drug use: No  . Sexual activity: Not on file  Other Topics Concern  . Not on file  Social History Narrative   Married wife Tanja Port from [place] in 2018    Never smoker    No guns, wears seat belt, safe in relationship    Self-employed   Social Determinants of Health   Financial Resource Strain: Not on file  Food Insecurity: Not on file  Transportation Needs: Not on file  Physical Activity: Not on file  Stress: Not on file  Social Connections: Not on file  Intimate Partner Violence: Not on file   Current Meds  Medication Sig  . amitriptyline (ELAVIL) 10 MG tablet Take 1 tablet (10 mg total) by mouth at bedtime.  . SUMAtriptan (IMITREX) 50 MG tablet TAKE ONE TABLET  BY MOUTH FOR HEADACHE prn , CAN REPEAT IN 2 HOURS if needed, max 100 mg per day.   No Known Allergies No results found for this or any previous visit (from the past 2160 hour(s)). Objective  Body mass index is 22.49 kg/m. Wt Readings from Last 3 Encounters:  08/20/20 139 lb 12.8 oz (63.4 kg)  06/05/19 140 lb 6.4 oz (63.7 kg)  02/25/19 137 lb (62.1 kg)   Temp Readings from Last 3 Encounters:  06/05/19 97.9 F (36.6 C)  02/25/19 98.2 F (36.8 C)  09/11/18 (!) 97.5 F (36.4 C) (Temporal)   BP Readings from Last 3 Encounters:  08/20/20 122/70  06/05/19 114/66  02/25/19 119/73   Pulse Readings from Last 3 Encounters:  08/20/20 64  06/05/19 (!) 56  02/25/19 (!) 57    Physical Exam Vitals and nursing note reviewed.  Constitutional:      Appearance: Normal appearance. She is well-developed and well-groomed.  HENT:     Head:  Normocephalic and atraumatic.  Cardiovascular:     Rate and Rhythm: Normal rate and regular rhythm.     Heart sounds: Normal heart sounds. No murmur heard.   Pulmonary:     Effort: Pulmonary effort is normal.     Breath sounds: Normal breath sounds.  Chest:     Chest wall: No mass.  Breasts: Breasts are symmetrical.     Right: Normal. No axillary adenopathy.     Left: Normal. No axillary adenopathy.    Abdominal:     Tenderness: There is no abdominal tenderness.  Lymphadenopathy:     Upper Body:     Right upper body: No axillary adenopathy.     Left upper body: No axillary adenopathy.  Skin:    General: Skin is warm and dry.  Neurological:     General: No focal deficit present.     Mental Status: She is alert and oriented to person, place, and time. Mental status is at baseline.     Gait: Gait normal.  Psychiatric:        Attention and Perception: Attention and perception normal.        Mood and Affect: Mood and affect normal.        Speech: Speech normal.        Behavior: Behavior normal. Behavior is cooperative.        Thought Content: Thought content normal.        Cognition and Memory: Cognition and memory normal.        Judgment: Judgment normal.     Assessment  Plan  Annual physical exam - Plan: Comprehensive metabolic panel, Lipid panel, CBC w/Diff, TSH, Urinalysis, Routine w reflex microscopic, Iron, TIBC and Ferritin Panel, Vitamin B12, Pathologist smear review Declines flu shot  covid 3/3 utd Tdap had 01/2019  Hep C/HIV declines Check hep B/MMR not protected disc vaccines today  Mammogram had 07/17/18 needs repeat right dx mammo and [place] Colonoscopy 08/01/2018 nl f/u in 10 years  Pap 07/18/2018 negative neg HIV   Dermatology no need for now eczema right forearm using vasoline for now  rec healthy diet and exercise she is vegan  Leukopenia, unspecified type - Plan: CBC w/Diff, Pathologist smear review  Iron deficiency - Plan: Iron, TIBC and Ferritin  Panel  B12 deficiency - Plan: Vitamin B12  Skin cancer screening - Plan: Ambulatory referral to Dermatology Skin lesion of face - Plan: Ambulatory referral to Dermatology  .   Provider: Dr. Olivia Mackie McLean-Scocuzza-Internal Medicine

## 2020-08-20 NOTE — Addendum Note (Signed)
Addended by: Orland Mustard on: 08/20/2020 05:15 PM   Modules accepted: Orders

## 2020-08-20 NOTE — Patient Instructions (Signed)
Be well and stay safe

## 2020-08-21 ENCOUNTER — Telehealth: Payer: Self-pay | Admitting: Internal Medicine

## 2020-08-21 DIAGNOSIS — E538 Deficiency of other specified B group vitamins: Secondary | ICD-10-CM

## 2020-08-21 LAB — URINALYSIS, ROUTINE W REFLEX MICROSCOPIC
Bilirubin, UA: NEGATIVE
Glucose, UA: NEGATIVE
Ketones, UA: NEGATIVE
Leukocytes,UA: NEGATIVE
Nitrite, UA: NEGATIVE
Protein,UA: NEGATIVE
RBC, UA: NEGATIVE
Specific Gravity, UA: <=1.005 — AB (ref 1.005–1.030)
Urobilinogen, Ur: 0.2 mg/dL (ref 0.2–1.0)
pH, UA: 8 — ABNORMAL HIGH (ref 5.0–7.5)

## 2020-08-21 NOTE — Telephone Encounter (Signed)
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

## 2020-08-21 NOTE — Telephone Encounter (Signed)
Please advise on frequency.

## 2020-08-21 NOTE — Telephone Encounter (Signed)
Per Patient's MyChart message from Dr. Aundra Dubin. She would like to come into office to have B12 injections done. Did not schedule nurse visit for injections, need to know how often given and make sure orders are in.

## 2020-08-24 ENCOUNTER — Ambulatory Visit
Admission: RE | Admit: 2020-08-24 | Discharge: 2020-08-24 | Disposition: A | Discharge status: Home / Self Care | Payer: BC Managed Care – PPO | Source: Ambulatory Visit | Attending: Internal Medicine | Admitting: Internal Medicine

## 2020-08-24 ENCOUNTER — Ambulatory Visit (INDEPENDENT_AMBULATORY_CARE_PROVIDER_SITE_OTHER): Payer: BC Managed Care – PPO

## 2020-08-24 ENCOUNTER — Other Ambulatory Visit: Payer: Self-pay

## 2020-08-24 DIAGNOSIS — Z1231 Encounter for screening mammogram for malignant neoplasm of breast: Secondary | ICD-10-CM

## 2020-08-24 DIAGNOSIS — R922 Inconclusive mammogram: Secondary | ICD-10-CM | POA: Diagnosis not present

## 2020-08-24 DIAGNOSIS — R928 Other abnormal and inconclusive findings on diagnostic imaging of breast: Secondary | ICD-10-CM | POA: Diagnosis not present

## 2020-08-24 DIAGNOSIS — E538 Deficiency of other specified B group vitamins: Secondary | ICD-10-CM

## 2020-08-24 LAB — PATHOLOGIST SMEAR REVIEW
Basophils Absolute: 0 10*3/uL (ref 0.0–0.2)
Basos: 1 %
EOS (ABSOLUTE): 0 10*3/uL (ref 0.0–0.4)
Eos: 1 %
Hematocrit: 39 % (ref 34.0–46.6)
Hemoglobin: 13.3 g/dL (ref 11.1–15.9)
Immature Grans (Abs): 0 10*3/uL (ref 0.0–0.1)
Immature Granulocytes: 0 %
Lymphocytes Absolute: 1.3 10*3/uL (ref 0.7–3.1)
Lymphs: 37 %
MCH: 32.1 pg (ref 26.6–33.0)
MCHC: 34.1 g/dL (ref 31.5–35.7)
MCV: 94 fL (ref 79–97)
Monocytes Absolute: 0.5 10*3/uL (ref 0.1–0.9)
Monocytes: 13 %
Neutrophils Absolute: 1.7 10*3/uL (ref 1.4–7.0)
Neutrophils: 48 %
Platelets: 298 10*3/uL (ref 150–450)
RBC: 4.14 x10E6/uL (ref 3.77–5.28)
RDW: 13.5 % (ref 11.7–15.4)
WBC: 3.6 10*3/uL (ref 3.4–10.8)

## 2020-08-24 LAB — IRON,TIBC AND FERRITIN PANEL
Ferritin: 19 ng/mL (ref 15–150)
Iron Saturation: 47 % (ref 15–55)
Iron: 117 ug/dL (ref 27–159)
Total Iron Binding Capacity: 250 ug/dL (ref 250–450)
UIBC: 133 ug/dL (ref 131–425)

## 2020-08-24 MED ORDER — CYANOCOBALAMIN 1000 MCG/ML IJ SOLN
1000.0000 ug | Freq: Once | INTRAMUSCULAR | Status: AC
  Administered 2020-08-24: 1000 ug via INTRAMUSCULAR

## 2020-08-24 NOTE — Progress Notes (Signed)
Patient presented for B 12 injection to right deltoid, patient voiced no concerns nor showed any signs of distress during injection. 

## 2020-08-25 MED ORDER — CYANOCOBALAMIN 1000 MCG/ML IJ SOLN: 1000.0000 ug | INTRAMUSCULAR | Status: AC

## 2020-08-25 NOTE — Telephone Encounter (Signed)
Left message to return call.  Patient needing same day appointment for Non-fasting labs and b-12 shots if calling back in. Patient will need to be placed on both the nurse and lab schedule.

## 2020-08-27 NOTE — Telephone Encounter (Signed)
Patient has had this done

## 2020-09-02 ENCOUNTER — Other Ambulatory Visit: Payer: Self-pay

## 2020-09-02 ENCOUNTER — Ambulatory Visit (INDEPENDENT_AMBULATORY_CARE_PROVIDER_SITE_OTHER): Payer: BC Managed Care – PPO

## 2020-09-02 DIAGNOSIS — E538 Deficiency of other specified B group vitamins: Secondary | ICD-10-CM

## 2020-09-02 MED ORDER — CYANOCOBALAMIN 1000 MCG/ML IJ SOLN
1000.0000 ug | Freq: Once | INTRAMUSCULAR | Status: AC
  Administered 2020-09-02: 1000 ug via INTRAMUSCULAR

## 2020-09-02 NOTE — Progress Notes (Signed)
Patient presented for B 12 injection to left deltoid, patient voiced no concerns nor showed any signs of distress during injection. 

## 2020-09-08 ENCOUNTER — Ambulatory Visit: Payer: BC Managed Care – PPO

## 2020-09-08 ENCOUNTER — Telehealth: Payer: Self-pay | Admitting: Family Medicine

## 2020-09-11 ENCOUNTER — Other Ambulatory Visit: Payer: Self-pay

## 2020-09-11 ENCOUNTER — Ambulatory Visit (INDEPENDENT_AMBULATORY_CARE_PROVIDER_SITE_OTHER): Payer: BC Managed Care – PPO

## 2020-09-11 VITALS — Temp 96.6°F

## 2020-09-11 DIAGNOSIS — E538 Deficiency of other specified B group vitamins: Secondary | ICD-10-CM

## 2020-09-11 MED ORDER — CYANOCOBALAMIN 1000 MCG/ML IJ SOLN
1000.0000 ug | Freq: Once | INTRAMUSCULAR | Status: AC
  Administered 2020-09-11: 1000 ug via INTRAMUSCULAR

## 2020-09-11 NOTE — Progress Notes (Signed)
Patient came in today for B-12 injection in right deltoid IM. Patient tolerated well.

## 2020-09-16 ENCOUNTER — Ambulatory Visit: Payer: BC Managed Care – PPO

## 2020-09-18 ENCOUNTER — Other Ambulatory Visit: Payer: Self-pay

## 2020-09-18 ENCOUNTER — Ambulatory Visit (INDEPENDENT_AMBULATORY_CARE_PROVIDER_SITE_OTHER): Payer: BC Managed Care – PPO

## 2020-09-18 DIAGNOSIS — E538 Deficiency of other specified B group vitamins: Secondary | ICD-10-CM | POA: Diagnosis not present

## 2020-09-18 MED ORDER — CYANOCOBALAMIN 1000 MCG/ML IJ SOLN
1000.0000 ug | Freq: Once | INTRAMUSCULAR | Status: AC
  Administered 2020-09-18: 1000 ug via INTRAMUSCULAR

## 2020-09-18 NOTE — Progress Notes (Signed)
Patient presented for B 12 injection to left deltoid, patient voiced no concerns nor showed any signs of distress during injection. 

## 2020-09-23 ENCOUNTER — Encounter: Payer: Self-pay | Admitting: Internal Medicine

## 2020-09-23 DIAGNOSIS — G43909 Migraine, unspecified, not intractable, without status migrainosus: Secondary | ICD-10-CM

## 2020-09-23 MED ORDER — SUMATRIPTAN SUCCINATE 50 MG PO TABS: ORAL_TABLET | ORAL | 2 refills | Status: DC

## 2020-09-23 NOTE — Addendum Note (Signed)
Addended by: Orland Mustard on: 09/23/2020 05:50 PM   Modules accepted: Orders

## 2020-10-02 ENCOUNTER — Other Ambulatory Visit: Payer: Self-pay

## 2020-10-02 ENCOUNTER — Other Ambulatory Visit (INDEPENDENT_AMBULATORY_CARE_PROVIDER_SITE_OTHER): Payer: BC Managed Care – PPO

## 2020-10-02 DIAGNOSIS — E538 Deficiency of other specified B group vitamins: Secondary | ICD-10-CM

## 2020-10-02 DIAGNOSIS — D72819 Decreased white blood cell count, unspecified: Secondary | ICD-10-CM

## 2020-10-02 DIAGNOSIS — R7989 Other specified abnormal findings of blood chemistry: Secondary | ICD-10-CM | POA: Diagnosis not present

## 2020-10-02 DIAGNOSIS — R946 Abnormal results of thyroid function studies: Secondary | ICD-10-CM

## 2020-10-02 LAB — VITAMIN B12: Vitamin B-12: 494 pg/mL (ref 211–911)

## 2020-10-02 LAB — CBC WITH DIFFERENTIAL/PLATELET
Basophils Absolute: 0 10*3/uL (ref 0.0–0.1)
Basophils Relative: 1 % (ref 0.0–3.0)
Eosinophils Absolute: 0 10*3/uL (ref 0.0–0.7)
Eosinophils Relative: 0.9 % (ref 0.0–5.0)
HCT: 37.5 % (ref 36.0–46.0)
Hemoglobin: 11.9 g/dL — ABNORMAL LOW (ref 12.0–15.0)
Lymphocytes Relative: 40.2 % (ref 12.0–46.0)
Lymphs Abs: 1.3 10*3/uL (ref 0.7–4.0)
MCHC: 31.7 g/dL (ref 30.0–36.0)
MCV: 97.5 fl (ref 78.0–100.0)
Monocytes Absolute: 0.5 10*3/uL (ref 0.1–1.0)
Monocytes Relative: 14.7 % — ABNORMAL HIGH (ref 3.0–12.0)
Neutro Abs: 1.4 10*3/uL (ref 1.4–7.7)
Neutrophils Relative %: 43.2 % (ref 43.0–77.0)
Platelets: 245 10*3/uL (ref 150.0–400.0)
RBC: 3.84 Mil/uL — ABNORMAL LOW (ref 3.87–5.11)
RDW: 14.3 % (ref 11.5–15.5)
WBC: 3.3 10*3/uL — ABNORMAL LOW (ref 4.0–10.5)

## 2020-10-02 LAB — TSH: TSH: 4.14 u[IU]/mL (ref 0.35–4.50)

## 2020-10-02 LAB — T3, FREE: T3, Free: 2.9 pg/mL (ref 2.3–4.2)

## 2020-10-02 LAB — T4, FREE: Free T4: 0.83 ng/dL (ref 0.60–1.60)

## 2020-10-03 LAB — THYROID PEROXIDASE ANTIBODY: Thyroperoxidase Ab SerPl-aCnc: <8 IU/mL (ref 0–34)

## 2020-10-07 ENCOUNTER — Encounter: Payer: Self-pay | Admitting: Internal Medicine

## 2020-10-08 NOTE — Telephone Encounter (Signed)
Veronica Glow McLean-Scocuzza, MD  10/04/2020 9:56 PM EDT      White blood cell ct still low and you are slightly anemic  -does she want consult with hematology to work this up?  B12 normal continue B12 shots does she want to try them at home? Thyroid labs improved and all normal

## 2020-10-12 NOTE — Addendum Note (Signed)
Addended by: Orland Mustard on: 10/12/2020 12:05 PM   Modules accepted: Orders

## 2020-10-20 ENCOUNTER — Ambulatory Visit (INDEPENDENT_AMBULATORY_CARE_PROVIDER_SITE_OTHER): Payer: BC Managed Care – PPO | Admitting: *Deleted

## 2020-10-20 ENCOUNTER — Other Ambulatory Visit: Payer: Self-pay

## 2020-10-20 DIAGNOSIS — E538 Deficiency of other specified B group vitamins: Secondary | ICD-10-CM

## 2020-10-20 MED ORDER — CYANOCOBALAMIN 1000 MCG/ML IJ SOLN
1000.0000 ug | Freq: Once | INTRAMUSCULAR | Status: AC
  Administered 2020-10-20: 1000 ug via INTRAMUSCULAR

## 2020-10-20 NOTE — Progress Notes (Signed)
Patient presented for B 12 injection to right deltoid, patient voiced no concerns nor showed any signs of distress during injection. 

## 2020-10-30 ENCOUNTER — Inpatient Hospital Stay: Payer: BC Managed Care – PPO | Attending: Internal Medicine | Admitting: Internal Medicine

## 2020-10-30 ENCOUNTER — Inpatient Hospital Stay: Payer: BC Managed Care – PPO

## 2020-10-30 ENCOUNTER — Encounter: Payer: Self-pay | Admitting: Internal Medicine

## 2020-10-30 DIAGNOSIS — E538 Deficiency of other specified B group vitamins: Secondary | ICD-10-CM | POA: Insufficient documentation

## 2020-10-30 DIAGNOSIS — Z87891 Personal history of nicotine dependence: Secondary | ICD-10-CM | POA: Insufficient documentation

## 2020-10-30 DIAGNOSIS — D72819 Decreased white blood cell count, unspecified: Secondary | ICD-10-CM

## 2020-10-30 DIAGNOSIS — D649 Anemia, unspecified: Secondary | ICD-10-CM | POA: Insufficient documentation

## 2020-10-30 DIAGNOSIS — Z8616 Personal history of COVID-19: Secondary | ICD-10-CM | POA: Diagnosis not present

## 2020-10-30 LAB — COMPREHENSIVE METABOLIC PANEL
ALT: 21 U/L (ref 0–44)
AST: 33 U/L (ref 15–41)
Albumin: 4.2 g/dL (ref 3.5–5.0)
Alkaline Phosphatase: 81 U/L (ref 38–126)
Anion gap: 10 (ref 5–15)
BUN: 10 mg/dL (ref 6–20)
CO2: 26 mmol/L (ref 22–32)
Calcium: 9.1 mg/dL (ref 8.9–10.3)
Chloride: 103 mmol/L (ref 98–111)
Creatinine, Ser: 0.61 mg/dL (ref 0.44–1.00)
GFR, Estimated: >60 mL/min (ref >60)
Glucose, Bld: 139 mg/dL — ABNORMAL HIGH (ref 70–99)
Potassium: 4.1 mmol/L (ref 3.5–5.1)
Sodium: 139 mmol/L (ref 135–145)
Total Bilirubin: 0.4 mg/dL (ref 0.3–1.2)
Total Protein: 7.1 g/dL (ref 6.5–8.1)

## 2020-10-30 LAB — LACTATE DEHYDROGENASE: LDH: 147 U/L (ref 98–192)

## 2020-10-30 LAB — HIV ANTIBODY (ROUTINE TESTING W REFLEX): HIV Screen 4th Generation wRfx: NONREACTIVE

## 2020-10-30 LAB — CBC WITH DIFFERENTIAL/PLATELET
Abs Immature Granulocytes: 0 10*3/uL (ref 0.00–0.07)
Basophils Absolute: 0 10*3/uL (ref 0.0–0.1)
Basophils Relative: 1 %
Eosinophils Absolute: 0 10*3/uL (ref 0.0–0.5)
Eosinophils Relative: 1 %
HCT: 39.7 % (ref 36.0–46.0)
Hemoglobin: 13.6 g/dL (ref 12.0–15.0)
Immature Granulocytes: 0 %
Lymphocytes Relative: 35 %
Lymphs Abs: 1.1 10*3/uL (ref 0.7–4.0)
MCH: 32.8 pg (ref 26.0–34.0)
MCHC: 34.3 g/dL (ref 30.0–36.0)
MCV: 95.7 fL (ref 80.0–100.0)
Monocytes Absolute: 0.3 10*3/uL (ref 0.1–1.0)
Monocytes Relative: 9 %
Neutro Abs: 1.7 10*3/uL (ref 1.7–7.7)
Neutrophils Relative %: 54 %
Platelets: 294 10*3/uL (ref 150–400)
RBC: 4.15 MIL/uL (ref 3.87–5.11)
RDW: 13.1 % (ref 11.5–15.5)
WBC: 3.1 10*3/uL — ABNORMAL LOW (ref 4.0–10.5)
nRBC: 0 % (ref 0.0–0.2)

## 2020-10-30 LAB — IRON AND TIBC
Iron: 79 ug/dL (ref 28–170)
Saturation Ratios: 26 % (ref 10.4–31.8)
TIBC: 308 ug/dL (ref 250–450)
UIBC: 229 ug/dL

## 2020-10-30 LAB — TECHNOLOGIST SMEAR REVIEW: Plt Morphology: ADEQUATE

## 2020-10-30 LAB — FERRITIN: Ferritin: 10 ng/mL — ABNORMAL LOW (ref 11–307)

## 2020-10-30 LAB — HEPATITIS B SURFACE ANTIGEN: Hepatitis B Surface Ag: NONREACTIVE

## 2020-10-30 LAB — HEPATITIS C ANTIBODY: HCV Ab: NONREACTIVE

## 2020-10-30 NOTE — Progress Notes (Addendum)
[facility] [place] NOTE  Patient Care Team: McLean-Scocuzza, Nino Glow, MD as PCP - General (Internal Medicine)  CHIEF COMPLAINTS/PURPOSE OF CONSULTATION: Leucopenia  # LEUCOPENIA/NEUTROPENIA- [since 2020 -WBC 3.3-3.4]ANC; Hb; platelets; CT Ab/US-?  Hepatitis/HIV/ Alcohol- none  # Mild Anemia- Hb 11; colo- March 2020- NEG [Dr.T][June 2022- Ferritin-10%; Iron sat-26%.  Oncology History   No history exists.     HISTORY OF PRESENTING ILLNESS:  Veronica Henderson 55 y.o.  female with NO history of smoking is here for further evaluation leucopenia.   Patient history of intermittent neutropenia since 2020.  Denies any frequent infections.  Denies any significant weight loss but denies any skin rash or Joint pains.  Denies any history of HIV/hepatitis.   Review of Systems  Constitutional:  Negative for chills, diaphoresis, fever, malaise/fatigue and weight loss.  HENT:  Negative for nosebleeds and sore throat.   Eyes:  Negative for double vision.  Respiratory:  Negative for cough, hemoptysis, sputum production, shortness of breath and wheezing.   Cardiovascular:  Negative for chest pain, palpitations, orthopnea and leg swelling.  Gastrointestinal:  Negative for abdominal pain, blood in stool, constipation, diarrhea, heartburn, melena, nausea and vomiting.  Genitourinary:  Negative for dysuria, frequency and urgency.  Musculoskeletal:  Negative for back pain and joint pain.  Skin: Negative.  Negative for itching and rash.  Neurological:  Negative for dizziness, tingling, focal weakness, weakness and headaches.  Endo/Heme/Allergies:  Does not bruise/bleed easily.  Psychiatric/Behavioral:  Negative for depression. The patient is not nervous/anxious and does not have insomnia.     MEDICAL HISTORY:  Past Medical History:  Diagnosis Date   COVID-19    02/26/20 fatigue x 5 days lost taste and smell    Leukopenia    Migraines    since childhood    SURGICAL HISTORY: Past Surgical  History:  Procedure Laterality Date   BREAST BIOPSY Right 2014   stereo- neg   BREAST BIOPSY Right 07/26/2018   distortion bx with affirm, x marker, Complex sclerosing lesion, Stromal Fibrosis and Sclerosing Adenosis   COLONOSCOPY WITH PROPOFOL N/A 08/01/2018   Procedure: COLONOSCOPY WITH PROPOFOL;  Surgeon: Virgel Manifold, MD;  Location: [facility] ENDOSCOPY;  Service: Endoscopy;  Laterality: N/A;   NO PAST SURGERIES      SOCIAL HISTORY: Social History   Socioeconomic History   Marital status: Married    Spouse name: Not on file   Number of children: Not on file   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 No smoking.    Social Determinants of Health   Financial Resource Strain: Not on file  Food Insecurity: Not on file  Transportation Needs: Not on file  Physical Activity: Not on file  Stress: Not on file  Social Connections: Not on file  Intimate  Partner Violence: Not on file    FAMILY HISTORY: Family History  Problem Relation Age of Onset   Heart disease Mother    Heart disease Father        mi   Hyperlipidemia Father    Hypertension Father    AAA (abdominal aortic aneurysm) Father        ruptured died 10   Breast cancer Sister 5   Migraines Sister    Cancer Sister        cervical age 81 y.o    Ankylosing spondylitis Sister    Stroke Maternal  Grandmother    Headache Maternal Grandmother    Other Nephew        GIST tumor     ALLERGIES:  has No Known Allergies.  MEDICATIONS:  Current Outpatient Medications  Medication Sig Dispense Refill   amitriptyline (ELAVIL) 10 MG tablet Take 1 tablet (10 mg total) by mouth at bedtime. 90 tablet 3   desonide (DESOWEN) 0.05 % cream Apply topically.     SUMAtriptan (IMITREX) 50 MG tablet TAKE ONE TABLET BY MOUTH FOR HEADACHE prn , CAN REPEAT IN 2 HOURS if needed, max 100 mg per day. 10 tablet 2   No current facility-administered medications for this visit.      PHYSICAL EXAMINATION:  Vitals:   10/30/20 1121  BP: 120/77  Pulse: 68  Resp: 16  Temp: (!) 97.4 F (36.3 C)  SpO2: 100%   Filed Weights   10/30/20 1121  Weight: 135 lb 9.6 oz (61.5 kg)    Physical Exam Vitals and nursing note reviewed.  Constitutional:      Comments: Ambulating: Independently.  Accompanied: none.  HENT:     Head: Normocephalic and atraumatic.     Mouth/Throat:     Pharynx: Oropharynx is clear.  Eyes:     Extraocular Movements: Extraocular movements intact.     Pupils: Pupils are equal, round, and reactive to light.  Cardiovascular:     Rate and Rhythm: Normal rate and regular rhythm.  Pulmonary:     Comments: Decreased breath sounds bilaterally.  Abdominal:     Palpations: Abdomen is soft.  Musculoskeletal:        General: Normal range of motion.     Cervical back: Normal range of motion.  Skin:    General: Skin is warm.  Neurological:     General: No focal deficit present.     Mental Status: She is alert and oriented to person, place, and time.  Psychiatric:        Behavior: Behavior normal.        Judgment: Judgment normal.     LABORATORY DATA:  I have reviewed the data as listed Lab Results  Component Value Date   WBC 3.1 (L) 10/30/2020   HGB 13.6 10/30/2020   HCT 39.7 10/30/2020   MCV 95.7 10/30/2020   PLT 294 10/30/2020   Recent Labs    08/20/20 0802  10/30/20 1201  NA 138 139  K 4.4 4.1  CL 103 103  CO2 29 26  GLUCOSE 93 139*  BUN 9 10  CREATININE 0.61 0.61  CALCIUM 9.6 9.1  GFRNONAA  --  >60  PROT 7.1 7.1  ALBUMIN 4.3 4.2  AST 25 33  ALT 15 21  ALKPHOS 85 81  BILITOT 0.6 0.4    RADIOGRAPHIC STUDIES: I have personally reviewed the radiological images as listed and agreed with the findings in the report. No results found.  ASSESSMENT & PLAN:  Leucopenia #Leukopenia/neutropenia- absolute neutrophil count -1000; normal hemoglobin/platelets. Patient is asymptomatic-no increased risk of infections. Suspect benign causes rather than any malignant causes. 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 No evidence of hepatosplenomegaly or liver disease.   # Recommend checking CBC CMP LDH We will review peripheral smear. I discussed the possibility/need for a bone marrow biopsy if significant neutropenia is continued. However we will plan to hold bone marrow biopsy for now.   #Mild anemia-colonoscopy March 2022-negative  # B12 def [vegan]-on B12 injections.  I think is reasonable to continue oral B12.  Do not suspect intestinal malabsorption rather than poor p.o. intake.   Thank you Dr. Terese Door. for allowing me to participate in the care of your pleasant patient. Please do not hesitate to contact me with questions or concerns in the interim.  # DISPOSITION:I will call pt # labs- ordered # follow up TBD-Dr.B  Addendum: Repeat labs show white count 3.1 and platelet count 1.7; no further work-up recommended.  Recommend follow-up in 6 months/MD labs.  Patient will be informed.   All questions were answered. The patient knows to call the clinic with any problems, questions or concerns.    Cammie Sickle, MD 11/11/2020 8:58 PM

## 2020-10-30 NOTE — Assessment & Plan Note (Addendum)
#  Leukopenia/neutropenia- absolute neutrophil count -1000; normal hemoglobin/platelets. Patient is asymptomatic-no increased risk of infections. Suspect benign causes rather than any malignant causes. 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 No evidence of hepatosplenomegaly or liver disease.   # Recommend checking CBC CMP LDH We will review peripheral smear. I discussed the possibility/need for a bone marrow biopsy if significant neutropenia is continued. However we will plan to hold bone marrow biopsy for now.   #Mild anemia-colonoscopy March 2022-negative  # B12 def [vegan]-on B12 injections.  I think is reasonable to continue oral B12.  Do not suspect intestinal malabsorption rather than poor p.o. intake.   Thank you Dr. Terese Door. for allowing me to participate in the care of your pleasant patient. Please do not hesitate to contact me with questions or concerns in the interim.  # DISPOSITION:I will call pt # labs- ordered # follow up TBD-Dr.B  Addendum: Repeat labs show white count 3.1 and platelet count 1.7; no further work-up recommended.  Recommend follow-up in 6 months/MD labs.  Patient will be informed.

## 2020-11-06 ENCOUNTER — Telehealth: Payer: Self-pay | Admitting: Internal Medicine

## 2020-11-06 DIAGNOSIS — D72819 Decreased white blood cell count, unspecified: Secondary | ICD-10-CM

## 2020-11-06 NOTE — Telephone Encounter (Signed)
Called pt; unable to reach the patient I left a voicemail that her counts over all stable. Recommend follow up in 6 months  C- in 6 months- MD;labs- cbc/cmp; dr.B

## 2020-11-09 NOTE — Telephone Encounter (Signed)
Orders added

## 2020-11-09 NOTE — Addendum Note (Signed)
Addended by: Delice Bison E on: 11/09/2020 08:58 AM   Modules accepted: Orders

## 2020-11-20 ENCOUNTER — Ambulatory Visit (INDEPENDENT_AMBULATORY_CARE_PROVIDER_SITE_OTHER): Payer: BC Managed Care – PPO

## 2020-11-20 ENCOUNTER — Other Ambulatory Visit: Payer: Self-pay

## 2020-11-20 ENCOUNTER — Telehealth: Payer: Self-pay | Admitting: Internal Medicine

## 2020-11-20 DIAGNOSIS — E538 Deficiency of other specified B group vitamins: Secondary | ICD-10-CM

## 2020-11-20 MED ORDER — CYANOCOBALAMIN 1000 MCG/ML IJ SOLN
1000.0000 ug | Freq: Once | INTRAMUSCULAR | Status: AC
  Administered 2020-11-20: 1000 ug via INTRAMUSCULAR

## 2020-11-20 NOTE — Telephone Encounter (Signed)
55 year old female with [employer] insurance. No documented shingles shots.   Okay to schedule nurse visit for shot?

## 2020-11-20 NOTE — Telephone Encounter (Signed)
PT would like to see if she can get her shingles shot.

## 2020-11-20 NOTE — Progress Notes (Signed)
Patient presented for B 12 injection to left deltoid, patient voiced no concerns nor showed any signs of distress during injection. 

## 2020-11-23 NOTE — Telephone Encounter (Signed)
Ok inform this is 2 doses 2nd dose has to get 2 months after 1st dose but before 6 months and schedule shingrix please  Thank you

## 2020-11-25 ENCOUNTER — Ambulatory Visit: Payer: BC Managed Care – PPO | Admitting: Neurology

## 2020-11-25 VITALS — BP 106/66 | HR 53 | Ht 66.1 in | Wt 136.0 lb

## 2020-11-25 DIAGNOSIS — G43909 Migraine, unspecified, not intractable, without status migrainosus: Secondary | ICD-10-CM | POA: Diagnosis not present

## 2020-11-25 MED ORDER — TOPIRAMATE 50 MG PO TABS: 50.0000 mg | ORAL_TABLET | Freq: Every day | ORAL | 6 refills | Status: DC

## 2020-11-25 NOTE — Telephone Encounter (Signed)
Left message to return call. Okay to schedule nurse visit for shingles vaccination

## 2020-11-25 NOTE — Progress Notes (Signed)
[facility]    Provider:  Dr Jaynee Eagles Requesting Provider: McLean-Scocuzza, Olivia Mackie * Primary Care Provider:  McLean-Scocuzza, Nino Glow, MD  CC:  Migraines  11/25/2020: Ajovy, injected it 4 times didn't feel like it helped, she didn't like needles, so was not a good experience. Saw Amy and tried amitriptyline, and imitrex. She is using all her imitrex monthly. 9 migraine days a month. She has tension in her neck in the afternoons. She has some shooting pain in the back of the right neck discussed posture, occipital neuralgia, reviewed pathophysiology and reviewed images online of occipital nerve. Asked her to keep a diary of when she gets the shooting pain, what is she doing, is her head flexed for example?  TriedArie Henderson, Elavil/Amitriptyline, Topiramate  HPI:  Veronica Henderson is a 55 y.o. female here as requested by McLean-Scocuzza, Olivia Mackie * for migraines.  Past medical history migraines since childhood.  She has a family history in her sister and maternal grandmother. She has had headaches since a child. She knows when the headache is coming and if she doesn't have imitrex it can last days. No aura. o vision changes. It is in the right eye, behind the eye, can spread to the other side, severe, pounding pulsating, severe, no aura. Last week she had a migraine 4 days in a row. She took 4 imitrex pills last week. She gets anxious thinking about getting a headache. She is having migraines more frequently. Beer can trigger. The migraines can rebound back. She has 10 headache days a month. OTC doesn't work. She has new pain in the occipital area, and she can wake up the migraines.No other focal neurologic deficits, associated symptoms, inciting events or modifiable factors.   No other focal neurologic deficits, associated symptoms, inciting events or modifiable factors.   Reviewed notes, labs and imaging from outside physicians, which showed:  CBC with low white blood cells 3.5 otherwise  normal, February 2020.  CMP was normal in January 2020.  I reviewed McLean-Scocuzza, Nino Glow, MD's notes.  Patient was seen in January of this year, she has had chronic migraines since a kid lasting 3 days, using 18 Imitrex daily over-the-counter medications.  She feels increased anxiety before the headache mostly they are in the right eye.  She has nausea vomiting, phonophobia, no significant photophobia, she is on ophthalmologist and per patient her eyes were okay, 2 to 3 cups of coffee a day, her sister and maternal grandmother had migraines which stopped at an older age 85s.  Sleeping 7 to 8 hours a night.  She followed up with physician again in February of this year and followed for leukopenia.  Review of Systems: Patient complains of symptoms per HPI as well as the following symptoms: headache . Pertinent negatives and positives per HPI. 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 No smoking.    Social Determinants of Health   Financial Resource Strain: Not on file  Food Insecurity: Not on file  Transportation Needs: Not on file  Physical Activity: Not on file  Stress: Not on file  Social Connections: Not on file   Intimate Partner Violence: Not on file    Family History  Problem Relation Age of Onset   Heart disease Mother    Heart disease Father        mi   Hyperlipidemia Father    Hypertension Father    AAA (abdominal aortic aneurysm) Father        ruptured died 26   Breast cancer Sister 25   Migraines Sister    Cancer Sister        cervical age 51 y.o    Ankylosing spondylitis Sister    Stroke Maternal Grandmother    Headache Maternal Grandmother    Other Nephew        GIST tumor     Past Medical History:  Diagnosis Date   COVID-19    02/26/20 fatigue x 5 days lost taste and smell    Leukopenia    Migraines    since childhood    Patient Active Problem List   Diagnosis Date Noted   Leucopenia 10/30/2020   Elevated TSH 08/20/2020   B12 deficiency 08/20/2020   Chronic migraine without aura without status migrainosus, not intractable 02/25/2019   Abnormal mammogram 09/11/2018   Encounter for screening colonoscopy    Annual physical exam 07/18/2018   Migraines 02/27/2017    Past Surgical History:  Procedure Laterality Date   BREAST BIOPSY Right 2014   stereo- neg   BREAST BIOPSY Right 07/26/2018   distortion bx with affirm, x marker, Complex sclerosing lesion, Stromal Fibrosis and Sclerosing Adenosis   COLONOSCOPY WITH PROPOFOL N/A 08/01/2018   Procedure: COLONOSCOPY WITH PROPOFOL;  Surgeon: Virgel Manifold, MD;  Location: [facility] ENDOSCOPY;  Service: Endoscopy;  Laterality: N/A;   NO PAST SURGERIES      Current Outpatient Medications  Medication Sig Dispense Refill   desonide (DESOWEN) 0.05 % cream Apply topically.     SUMAtriptan (IMITREX) 50 MG tablet TAKE ONE TABLET BY MOUTH FOR HEADACHE prn , CAN REPEAT IN 2 HOURS if needed, max 100 mg per day. 10 tablet 2   topiramate (TOPAMAX) 50 MG tablet Take 1 tablet (50 mg total) by mouth at bedtime. 90 tablet 6   No current facility-administered medications for this visit.    Allergies as of 11/25/2020   (No Known  Allergies)    Vitals: BP 106/66   Pulse (!) 53   Ht 5' 6.1" (1.679 m)   Wt 136 lb (61.7 kg)   LMP 06/06/2012 (Approximate)   BMI 21.88 kg/m  Last Weight:  Wt Readings from Last 1 Encounters:  11/25/20 136 lb (61.7 kg)   Last Height:   Ht Readings from Last 1 Encounters:  11/25/20 5' 6.1" (1.679 m)    Exam: NAD, pleasant                  Speech:    Speech is normal; fluent and spontaneous with normal comprehension.  Cognition:    The patient is oriented to  person, place, and time;     recent and remote memory intact;     language fluent;    Cranial Nerves:    The pupils are equal, round, and reactive to light.Trigeminal sensation is intact and the muscles of mastication are normal. The face is symmetric. The palate elevates in the midline. Hearing intact. Voice is normal. Shoulder shrug is normal. The tongue has normal motion without fasciculations.   Coordination:  No dysmetria  Motor Observation:    No asymmetry, no atrophy, and no involuntary movements noted. Tone:    Normal muscle tone.     Strength:    Strength is V/V in the upper and lower limbs.      Sensation: intact to LT   Assessment/Plan: hilarious 55 year old patient with episodic migraines.   - Amitriptyline not helping, cannot increase dose, discussed options. Did not like Ajovy, needles were a bad experience, did not work after 4 injections so would not try CGRP injectables again. Propranolol contraindicated due to low BP.  - If topiramate doesn't help, try qulipta - Will hols sumatriptan at 50mg , does well 90% of the time but can consider increeasing if needed. - She has tension in her neck in the afternoons. She has some shooting pain in the back of the right neck discussed posture, occipital neuralgia, reviewed pathophysiology and reviewed images online of occipital nerve. Asked her to keep a diary of when she gets the shooting pain, what is she doing, is her head flexed for example? Could try muscle  relaxers, PT or occipital nerve blocks - RTC 3 months or mychart [place] in the meantime  No orders of the defined types were placed in this encounter.  Meds ordered this encounter  Medications   topiramate (TOPAMAX) 50 MG tablet    Sig: Take 1 tablet (50 mg total) by mouth at bedtime.    Dispense:  90 tablet    Refill:  6     Cc: McLean-Scocuzza, Catheryn Bacon, MD  [facility] [address] [place], [place] [address]  Phone [phone] Fax [phone]  I spent 40 minutes of face-to-face and non-face-to-face time with patient on the  1. Migraine without status migrainosus, not intractable, unspecified migraine type    diagnosis.  This included previsit chart review, lab review, study review, order entry, electronic health record documentation, patient education on the different diagnostic and therapeutic options, counseling and coordination of care, risks and benefits of management, compliance, or risk factor reduction

## 2020-11-25 NOTE — Telephone Encounter (Signed)
Patient returned office phone call. Appointment

## 2020-11-25 NOTE — Patient Instructions (Addendum)
Stop Amitriptyline Start Topiramate at 50mg   Topiramate tablets What is this medication? TOPIRAMATE (toe PYRE a mate) treats seizures in people with epilepsy. It is also used to prevent migraines. It works by calming overactive nerves in yourbody. This medicine may be used for other purposes; ask your health care provider orpharmacist if you have questions. COMMON BRAND NAME(S): Topamax, Topiragen What should I tell my care team before I take this medication? They need to know if you have any of these conditions: Bleeding disorder Kidney disease Lung disease Suicidal thoughts, plans or attempt An unusual or allergic reaction to topiramate, other medications, foods, dyes, or preservatives Pregnant or trying to get pregnant Breast-feeding How should I use this medication? Take this medication by mouth with water. Take it as directed on the prescription label at the same time every day. Do not cut, crush or chew this medicine. Swallow the tablets whole. You can take it with or without food. If it upsets your stomach, take it with food. Keep taking it unless your care teamtells you to stop. A special MedGuide will be given to you by the pharmacist with eachprescription and refill. Be sure to read this information carefully each time. Talk to your care team about the use of this medication in children. While it may be prescribed for children as young as 2 years for selected conditions,precautions do apply. Overdosage: If you think you have taken too much of this medicine contact apoison control center or emergency room at once. NOTE: This medicine is only for you. Do not share this medicine with others. What if I miss a dose? If you miss a dose, take it as soon as you can unless it is within 6 hours of the next dose. If it is within 6 hours of the next dose, skip the missed dose.Take the next dose at the normal time. Do not take double or extra doses. What may interact with this  medication? Acetazolamide Alcohol Antihistamines for allergy, cough, and cold Aspirin and aspirin-like medications Atropine Birth control pills Certain medications for anxiety or sleep Certain medications for bladder problems like oxybutynin, tolterodine Certain medications for depression like amitriptyline, fluoxetine, sertraline Certain medications for Parkinson's disease like benztropine, trihexyphenidyl Certain medications for seizures like carbamazepine, lamotrigine, phenobarbital, phenytoin, primidone, valproic acid, zonisamide Certain medications for stomach problems like dicyclomine, hyoscyamine Certain medications for travel sickness like scopolamine Certain medications that treat or prevent blood clots like warfarin, enoxaparin, dalteparin, apixaban, dabigatran, and rivaroxaban Digoxin Diltiazem General anesthetics like halothane, isoflurane, methoxyflurane, propofol Glyburide Hydrochlorothiazide Ipratropium Lithium Medications that relax muscles Metformin Narcotic medications for pain NSAIDs, medications for pain and inflammation, like ibuprofen or naproxen Phenothiazines like chlorpromazine, mesoridazine, prochlorperazine, thioridazine Pioglitazone This list may not describe all possible interactions. Give your health care provider a list of all the medicines, herbs, non-prescription drugs, or dietary supplements you use. Also tell them if you smoke, drink alcohol, or use illegaldrugs. Some items may interact with your medicine. What should I watch for while using this medication? Visit your care team for regular checks on your progress. Tell your care teamif your symptoms do not start to get better or if they get worse. Do not suddenly stop taking this medication. You may develop a severe reaction. Your care team will tell you how much medication to take. If your care team wants you to stop the medication, the dose may be slowly lowered over time toavoid any side  effects. Wear a medical ID bracelet or chain. Carry a  card that describes yourcondition. List the medications and doses you take on the card. You may get drowsy or dizzy. Do not drive, use machinery, or do anything that needs mental alertness until you know how this medication affects you. Do not stand up or sit up quickly, especially if you are an older patient. This reduces the risk of dizzy or fainting spells. Alcohol may interfere with theeffects of this medication. Avoid alcoholic drinks. This medication may cause serious skin reactions. They can happen weeks to months after starting the medication. Contact your care team right away if you notice fevers or flu-like symptoms with a rash. The rash may be red or purple and then turn into blisters or peeling of the skin. Or, you might notice a red rash with swelling of the face, lips or lymph nodes in your neck or under yourarms. Watch for new or worsening thoughts of suicide or depression. This includes sudden changes in mood, behaviors, or thoughts. These changes can happen at any time but are more common in the beginning of treatment or after a change in dose. Call your care team right away if you experience these thoughts orworsening depression. This medication may slow your child's growth if it is taken for a long time athigh doses. Your care team will monitor your child's growth. Using this medication for a long time may weaken your bones. The risk of bonefractures may be increased. Talk to your care team about your bone health. Do not become pregnant while taking this medication. Hormone forms of birth control may not work as well with this medication. Talk to your care team about other forms of birth control. There is potential for serious harm to an unbornchild. Tell your care team right away if you think you might be pregnant. What side effects may I notice from receiving this medication? Side effects that you should report to your care team as  soon as possible: Allergic reactions-skin rash, itching, hives, swelling of the face, lips, tongue, or throat High acid levels-trouble breathing, fast irregular heartbeat, headache, confusion, unusually weak or tired, nausea, vomiting High ammonia levels-unusual weakness or fatigue, confusion, loss of appetite, nausea, vomiting, seizures High fever, fever that does not go away, or decreased sweating Kidney stones-blood in the urine, pain or trouble passing urine, pain in the lower back or sides Redness, blistering, peeling or loosening of the skin, including inside the mouth Sudden eye pain or change in vision such as blurry vision, seeing halos around lights, vision loss Thoughts of suicide or self-harm, worsening mood, feelings of depression Side effects that usually do not require medical attention (report to your careteam if they continue or are bothersome): Anxiety, nervousness Change in taste Diarrhea Dizziness Drowsiness Fatigue Loss of appetite with weight loss Pain, tingling, or numbness in the hands or feet Trouble concentrating Trouble speaking This list may not describe all possible side effects. Call your doctor for medical advice about side effects. You may report side effects to FDA at1-800-FDA-1088. Where should I keep my medication? Keep out of the reach of children and pets. Store between 15 and 30 degrees C (59 and 86 degrees F). Protect from moisture. Keep the container tightly closed. Get rid of any unused medication after theexpiration date. To get rid of medications that are no longer needed or have expired: Take the medication to a medication take-back program. Check with your pharmacy or law enforcement to find a location. If you cannot return the medication, check the label or package  insert to see if the medication should be thrown out in the garbage or flushed down the toilet. If you are not sure, ask your care team. If it is safe to put it in the trash, empty  the medication out of the container. Mix the medication with cat litter, dirt, coffee grounds, or other unwanted substance. Seal the mixture in a bag or container. Put it in the trash. NOTE: This sheet is a summary. It may not cover all possible information. If you have questions about this medicine, talk to your doctor, pharmacist, orhealth care provider.  2022 Elsevier/Gold Standard (2020-06-22 10:05:08)

## 2020-11-26 NOTE — Telephone Encounter (Signed)
Nurse visit scheduled for 12/22/20

## 2020-12-17 ENCOUNTER — Other Ambulatory Visit: Payer: Self-pay | Admitting: Neurology

## 2020-12-17 MED ORDER — SUMATRIPTAN SUCCINATE 100 MG PO TABS
100.0000 mg | ORAL_TABLET | Freq: Once | ORAL | 12 refills | Status: DC | PRN
Start: 2020-12-17 — End: 2021-11-30

## 2020-12-22 ENCOUNTER — Ambulatory Visit: Payer: BC Managed Care – PPO

## 2020-12-23 ENCOUNTER — Ambulatory Visit (INDEPENDENT_AMBULATORY_CARE_PROVIDER_SITE_OTHER): Payer: BC Managed Care – PPO

## 2020-12-23 ENCOUNTER — Other Ambulatory Visit: Payer: Self-pay

## 2020-12-23 DIAGNOSIS — Z23 Encounter for immunization: Secondary | ICD-10-CM

## 2020-12-23 DIAGNOSIS — E538 Deficiency of other specified B group vitamins: Secondary | ICD-10-CM

## 2020-12-23 MED ORDER — CYANOCOBALAMIN 1000 MCG/ML IJ SOLN
1000.0000 ug | Freq: Once | INTRAMUSCULAR | Status: AC
  Administered 2020-12-23: 1000 ug via INTRAMUSCULAR

## 2020-12-23 NOTE — Progress Notes (Signed)
Patient presented for B 12 injection to right deltoid, patient voiced no concerns nor showed any signs of distress during injection.    Pt presented for a shingles vaccine to left deltoid, patient voiced no concerns nor showed any signs of distress during injection. See injection orders from Dr. Olivia Mackie Mclean-Scocuzza on 11/23/20

## 2021-01-26 ENCOUNTER — Other Ambulatory Visit: Payer: Self-pay

## 2021-01-26 ENCOUNTER — Ambulatory Visit (INDEPENDENT_AMBULATORY_CARE_PROVIDER_SITE_OTHER): Payer: BC Managed Care – PPO

## 2021-01-26 DIAGNOSIS — E538 Deficiency of other specified B group vitamins: Secondary | ICD-10-CM | POA: Diagnosis not present

## 2021-01-26 MED ORDER — CYANOCOBALAMIN 1000 MCG/ML IJ SOLN
1000.0000 ug | Freq: Once | INTRAMUSCULAR | Status: AC
  Administered 2021-01-26: 1000 ug via INTRAMUSCULAR

## 2021-01-26 NOTE — Progress Notes (Signed)
Patient presented for B 12 injection to left deltoid, patient voiced no concerns nor showed any signs of distress during injection. 

## 2021-02-18 MED ORDER — QULIPTA 60 MG PO TABS
60.0000 mg | ORAL_TABLET | Freq: Every day | ORAL | 5 refills | Status: DC
Start: 2021-02-18 — End: 2021-04-27

## 2021-02-24 ENCOUNTER — Ambulatory Visit (INDEPENDENT_AMBULATORY_CARE_PROVIDER_SITE_OTHER): Payer: BC Managed Care – PPO | Admitting: Dermatology

## 2021-02-24 ENCOUNTER — Other Ambulatory Visit: Payer: Self-pay

## 2021-02-24 DIAGNOSIS — Z538 Procedure and treatment not carried out for other reasons: Secondary | ICD-10-CM

## 2021-02-26 ENCOUNTER — Ambulatory Visit: Payer: BC Managed Care – PPO

## 2021-03-01 ENCOUNTER — Ambulatory Visit (INDEPENDENT_AMBULATORY_CARE_PROVIDER_SITE_OTHER): Payer: BC Managed Care – PPO

## 2021-03-01 ENCOUNTER — Other Ambulatory Visit: Payer: Self-pay

## 2021-03-01 DIAGNOSIS — E538 Deficiency of other specified B group vitamins: Secondary | ICD-10-CM | POA: Diagnosis not present

## 2021-03-01 MED ORDER — CYANOCOBALAMIN 1000 MCG/ML IJ SOLN
1000.0000 ug | Freq: Once | INTRAMUSCULAR | Status: AC
  Administered 2021-03-01: 1000 ug via INTRAMUSCULAR

## 2021-03-01 NOTE — Progress Notes (Signed)
Patient presented for B 12 injection to right deltoid, patient voiced no concerns nor showed any signs of distress during injection. 

## 2021-03-04 ENCOUNTER — Ambulatory Visit: Payer: BC Managed Care – PPO | Admitting: Dermatology

## 2021-03-04 ENCOUNTER — Encounter: Payer: Self-pay | Admitting: Dermatology

## 2021-03-04 ENCOUNTER — Other Ambulatory Visit: Payer: Self-pay

## 2021-03-04 DIAGNOSIS — Z1283 Encounter for screening for malignant neoplasm of skin: Secondary | ICD-10-CM | POA: Diagnosis not present

## 2021-03-04 DIAGNOSIS — L239 Allergic contact dermatitis, unspecified cause: Secondary | ICD-10-CM

## 2021-03-04 DIAGNOSIS — L237 Allergic contact dermatitis due to plants, except food: Secondary | ICD-10-CM

## 2021-03-04 DIAGNOSIS — L578 Other skin changes due to chronic exposure to nonionizing radiation: Secondary | ICD-10-CM

## 2021-03-04 DIAGNOSIS — L814 Other melanin hyperpigmentation: Secondary | ICD-10-CM

## 2021-03-04 DIAGNOSIS — D229 Melanocytic nevi, unspecified: Secondary | ICD-10-CM

## 2021-03-04 DIAGNOSIS — D1800 Hemangioma unspecified site: Secondary | ICD-10-CM

## 2021-03-04 DIAGNOSIS — L821 Other seborrheic keratosis: Secondary | ICD-10-CM | POA: Diagnosis not present

## 2021-03-04 DIAGNOSIS — Z808 Family history of malignant neoplasm of other organs or systems: Secondary | ICD-10-CM

## 2021-03-04 MED ORDER — TRIAMCINOLONE ACETONIDE 0.1 % EX OINT: TOPICAL_OINTMENT | CUTANEOUS | 0 refills | Status: DC

## 2021-03-04 MED ORDER — TRIAMCINOLONE ACETONIDE 0.1 % EX OINT: TOPICAL_OINTMENT | CUTANEOUS | 2 refills | Status: DC

## 2021-03-04 NOTE — Patient Instructions (Addendum)
Recommend taking Heliocare sun protection supplement daily in sunny weather for additional sun protection. For maximum protection on the sunniest days, you can take up to 2 capsules of regular Heliocare OR take 1 capsule of Heliocare Ultra. For prolonged exposure (such as a full day in the sun), you can repeat your dose of the supplement 4 hours after your first dose. Heliocare can be purchased at [facility] or at [email or web address].    If you have any questions or concerns for your doctor, please call our main line at [phone] and press option 4 to reach your doctor's medical assistant. If no one answers, please leave a voicemail as directed and we will return your call as soon as possible. Messages left after 4 pm will be answered the following business day.   You may also send us a message via MyChart. We typically respond to MyChart messages within 1-2 business days.  For prescription refills, please ask your pharmacy to contact our office. Our fax number is [phone].  If you have an urgent issue when the clinic is closed that cannot wait until the next business day, you can page your doctor at the number below.    Please note that while we do our best to be available for urgent issues outside of office hours, we are not available 24/7.   If you have an urgent issue and are unable to reach us, you may choose to seek medical care at your doctor's office, retail clinic, urgent care center, or emergency room.  If you have a medical emergency, please immediately call 911 or go to the emergency department.  Pager Numbers  - Dr. Kowalski: [phone]  - Dr. Moye: [phone]  - Dr. Stewart: [phone]  In the event of inclement weather, please call our main line at [phone] for an update on the status of any delays or closures.  Dermatology Medication Tips: Please keep the boxes that topical medications come in in order to help keep track of the instructions about  where and how to use these. Pharmacies typically print the medication instructions only on the boxes and not directly on the medication tubes.   If your medication is too expensive, please contact our office at [phone] option 4 or send us a message through MyChart.   We are unable to tell what your co-pay for medications will be in advance as this is different depending on your insurance coverage. However, we may be able to find a substitute medication at lower cost or fill out paperwork to get insurance to cover a needed medication.   If a prior authorization is required to get your medication covered by your insurance company, please allow us 1-2 business days to complete this process.  Drug prices often vary depending on where the prescription is filled and some pharmacies may offer cheaper prices.  The website www.goodrx.com contains coupons for medications through different pharmacies. The prices here do not account for what the cost may be with help from insurance (it may be cheaper with your insurance), but the website can give you the price if you did not use any insurance.  - You can print the associated coupon and take it with your prescription to the pharmacy.  - You may also stop by our office during regular business hours and pick up a GoodRx coupon card.  - If you need your prescription sent electronically to a different pharmacy, notify our office through [facility] MyChart or by phone at [phone] option   4.  

## 2021-03-04 NOTE — Progress Notes (Addendum)
New Patient Visit  Subjective  Serine Kea is a 55 y.o. female who presents for the following: Annual Exam (Fhx of skin CA in father and brother's - patient has noticed a lesion around her L med canthus area that she would like checked today.). The patient presents for Total-Body Skin Exam (TBSE) for skin cancer screening and mole check.  She does get an itchy rash at arms occasionally after handling cucumber plants in the garden.   The following portions of the chart were reviewed this encounter and updated as appropriate:   Tobacco  Allergies  Meds  Problems  Med Hx  Surg Hx  Fam Hx      Review of Systems:  No other skin or systemic complaints except as noted in HPI or Assessment and Plan.  Objective  Well appearing patient in no apparent distress; mood and affect are within normal limits.  A full examination was performed including scalp, head, eyes, ears, nose, lips, neck, chest, axillae, abdomen, back, buttocks, bilateral upper extremities, bilateral lower extremities, hands, feet, fingers, toes, fingernails, and toenails. All findings within normal limits unless otherwise noted below.  L med canthus Stuck-on, waxy, tan-brown papule or plaque --Discussed benign etiology and prognosis.   B/L arms Erythematous patches at arms   Assessment & Plan  Seborrheic keratosis L med canthus  Reassured benign age-related growth.  Recommend observation.  Discussed cryotherapy if spot(s) become irritated or inflamed.  Allergic contact dermatitis, unspecified trigger B/L arms  Hx of reaction to cucumber and squash plants while gardening.   Chronic condition with duration or expected duration over one year. Condition is bothersome to patient. Not currently at goal.  Discussed option of extensive patch testing at [facility]. Deferred today.  Start TMC 0.1% ointment BID up to 2 weeks. Topical steroids (such as triamcinolone, fluocinolone, fluocinonide, mometasone, clobetasol,  halobetasol, betamethasone, hydrocortisone) can cause thinning and lightening of the skin if they are used for too long in the same area. Your physician has selected the right strength medicine for your problem and area affected on the body. Please use your medication only as directed by your physician to prevent side effects.   If she needs TMC often, she will call and we will switch to Protopic.    Related Medications triamcinolone ointment (KENALOG) 0.1 % Apply to aa's rash BID up to one week. Avoid the face, groin, and axilla.  Lentigines - Scattered tan macules - Due to sun exposure - Benign-appearing, observe - Recommend daily broad spectrum sunscreen SPF 30+ to sun-exposed areas, reapply every 2 hours as needed. - Call for any changes  Seborrheic Keratoses - Stuck-on, waxy, tan-brown papules and/or plaques  - Benign-appearing - Discussed benign etiology and prognosis. - Observe - Call for any changes  Melanocytic Nevi - Tan-brown and/or pink-flesh-colored symmetric macules and papules - Benign appearing on exam today - Observation - Call clinic for new or changing moles - Recommend daily use of broad spectrum spf 30+ sunscreen to sun-exposed areas.   Hemangiomas - Red papules - Discussed benign nature - Observe - Call for any changes  Actinic Damage - Chronic condition, secondary to cumulative UV/sun exposure - diffuse scaly erythematous macules with underlying dyspigmentation - Recommend daily broad spectrum sunscreen SPF 30+ to sun-exposed areas, reapply every 2 hours as needed.  - Staying in the shade or wearing long sleeves, sun glasses (UVA+UVB protection) and wide brim hats (4-inch brim around the entire circumference of the hat) are also recommended for sun protection.  -  Call for new or changing lesions.  Skin cancer screening performed today.  Return for TBSE in 1-2 years or PRN.  Luther Redo, CMA, am acting as scribe for Forest Gleason, MD  .  Documentation: I have reviewed the above documentation for accuracy and completeness, and I agree with the above.  Forest Gleason, MD

## 2021-03-08 NOTE — Progress Notes (Signed)
Patient rescheduled for next week.

## 2021-03-15 ENCOUNTER — Telehealth: Payer: BC Managed Care – PPO | Admitting: Neurology

## 2021-04-01 ENCOUNTER — Telehealth: Payer: Self-pay | Admitting: Internal Medicine

## 2021-04-01 ENCOUNTER — Encounter: Payer: Self-pay | Admitting: *Deleted

## 2021-04-01 NOTE — Telephone Encounter (Signed)
Pt called to cancel her Dec. Appt. Please call and reschedule at [phone]

## 2021-04-02 NOTE — Telephone Encounter (Signed)
Patient spoke with [facility] and r/s her apt

## 2021-04-05 ENCOUNTER — Ambulatory Visit: Payer: BC Managed Care – PPO

## 2021-04-06 ENCOUNTER — Ambulatory Visit (INDEPENDENT_AMBULATORY_CARE_PROVIDER_SITE_OTHER): Payer: BC Managed Care – PPO

## 2021-04-06 ENCOUNTER — Other Ambulatory Visit: Payer: Self-pay

## 2021-04-06 DIAGNOSIS — E538 Deficiency of other specified B group vitamins: Secondary | ICD-10-CM | POA: Diagnosis not present

## 2021-04-06 MED ORDER — CYANOCOBALAMIN 1000 MCG/ML IJ SOLN
1000.0000 ug | Freq: Once | INTRAMUSCULAR | Status: AC
  Administered 2021-04-06: 1000 ug via INTRAMUSCULAR

## 2021-04-06 NOTE — Progress Notes (Signed)
Patient presented for B 12 injection to right deltoid, patient voiced no concerns nor showed any signs of distress during injection. 

## 2021-04-26 ENCOUNTER — Telehealth: Payer: Self-pay | Admitting: *Deleted

## 2021-04-26 ENCOUNTER — Encounter: Payer: Self-pay | Admitting: Neurology

## 2021-04-26 NOTE — Telephone Encounter (Signed)
Rowan Blase KeyJoyce Gross - PA Case ID: [id number] Waiting on approval

## 2021-04-27 ENCOUNTER — Other Ambulatory Visit: Payer: Self-pay

## 2021-04-27 ENCOUNTER — Encounter: Payer: Self-pay | Admitting: Internal Medicine

## 2021-04-27 ENCOUNTER — Inpatient Hospital Stay (HOSPITAL_BASED_OUTPATIENT_CLINIC_OR_DEPARTMENT_OTHER): Payer: BC Managed Care – PPO | Admitting: Internal Medicine

## 2021-04-27 ENCOUNTER — Inpatient Hospital Stay: Payer: BC Managed Care – PPO | Attending: Internal Medicine

## 2021-04-27 DIAGNOSIS — D649 Anemia, unspecified: Secondary | ICD-10-CM | POA: Insufficient documentation

## 2021-04-27 DIAGNOSIS — Z8616 Personal history of COVID-19: Secondary | ICD-10-CM | POA: Insufficient documentation

## 2021-04-27 DIAGNOSIS — D709 Neutropenia, unspecified: Secondary | ICD-10-CM | POA: Insufficient documentation

## 2021-04-27 DIAGNOSIS — F1090 Alcohol use, unspecified, uncomplicated: Secondary | ICD-10-CM | POA: Diagnosis not present

## 2021-04-27 DIAGNOSIS — D72819 Decreased white blood cell count, unspecified: Secondary | ICD-10-CM | POA: Diagnosis not present

## 2021-04-27 DIAGNOSIS — Z803 Family history of malignant neoplasm of breast: Secondary | ICD-10-CM | POA: Insufficient documentation

## 2021-04-27 LAB — COMPREHENSIVE METABOLIC PANEL
ALT: 20 U/L (ref 0–44)
AST: 28 U/L (ref 15–41)
Albumin: 4.5 g/dL (ref 3.5–5.0)
Alkaline Phosphatase: 70 U/L (ref 38–126)
Anion gap: 11 (ref 5–15)
BUN: 12 mg/dL (ref 6–20)
CO2: 28 mmol/L (ref 22–32)
Calcium: 9.4 mg/dL (ref 8.9–10.3)
Chloride: 98 mmol/L (ref 98–111)
Creatinine, Ser: 0.61 mg/dL (ref 0.44–1.00)
GFR, Estimated: >60 mL/min (ref >60)
Glucose, Bld: 88 mg/dL (ref 70–99)
Potassium: 4.2 mmol/L (ref 3.5–5.1)
Sodium: 137 mmol/L (ref 135–145)
Total Bilirubin: 0.5 mg/dL (ref 0.3–1.2)
Total Protein: 7.8 g/dL (ref 6.5–8.1)

## 2021-04-27 LAB — CBC WITH DIFFERENTIAL/PLATELET
Abs Immature Granulocytes: 0.01 10*3/uL (ref 0.00–0.07)
Basophils Absolute: 0 10*3/uL (ref 0.0–0.1)
Basophils Relative: 0 %
Eosinophils Absolute: 0 10*3/uL (ref 0.0–0.5)
Eosinophils Relative: 0 %
HCT: 42.4 % (ref 36.0–46.0)
Hemoglobin: 14.3 g/dL (ref 12.0–15.0)
Immature Granulocytes: 0 %
Lymphocytes Relative: 30 %
Lymphs Abs: 1.4 10*3/uL (ref 0.7–4.0)
MCH: 32.9 pg (ref 26.0–34.0)
MCHC: 33.7 g/dL (ref 30.0–36.0)
MCV: 97.5 fL (ref 80.0–100.0)
Monocytes Absolute: 0.6 10*3/uL (ref 0.1–1.0)
Monocytes Relative: 12 %
Neutro Abs: 2.6 10*3/uL (ref 1.7–7.7)
Neutrophils Relative %: 58 %
Platelets: 302 10*3/uL (ref 150–400)
RBC: 4.35 MIL/uL (ref 3.87–5.11)
RDW: 12.7 % (ref 11.5–15.5)
WBC: 4.6 10*3/uL (ref 4.0–10.5)
nRBC: 0 % (ref 0.0–0.2)

## 2021-04-27 MED ORDER — QULIPTA 60 MG PO TABS: 60.0000 mg | ORAL_TABLET | Freq: Every day | ORAL | 5 refills | Status: DC

## 2021-04-27 NOTE — Assessment & Plan Note (Addendum)
#  Leukopenia/neutropenia- absolute neutrophil count -1000; normal hemoglobin/platelets.  Intermittent.  Likely viral infection versus other benign causes.  Patient asymptomatic.  #Today white count is 4.2.  ANC 2.6.  Hold off any further work-up.  Patient continues to be asymptomatic   # B12 def [vegan]-on B12 injections.  I think is reasonable to continue oral B12.    #Since patient is clinically stable I think is reasonable for the patient to follow-up with PCP/can follow-up with [place] as needed.  Patient comfortable with the plan; to call us if any questions or concerns in the interim.   # DISPOSITION: # follow up as needed-Dr.B

## 2021-04-27 NOTE — Progress Notes (Signed)
Patient denies new problems/concerns today.   °

## 2021-04-27 NOTE — Progress Notes (Signed)
[facility] NOTE  Patient Care Team: McLean-Scocuzza, Nino Glow, MD as PCP - General (Internal Medicine)  CHIEF COMPLAINTS/PURPOSE OF CONSULTATION: Leucopenia  # LEUCOPENIA/NEUTROPENIA- [since 2020 -WBC 3.3-3.4]ANC; Hb; platelets; CT Ab/US-?  Hepatitis/HIV/ Alcohol- none  # Mild Anemia- Hb 11; colo- March 2020- NEG [Dr.T][June 2022- Ferritin-10%; Iron sat-26%.  Oncology History   No history exists.     HISTORY OF PRESENTING ILLNESS:  Veronica Henderson 55 y.o.  female is here for follow-up of her mild intermittent neutropenia.  Patient denies any symptoms.  Denies any infections.   Review of Systems  Constitutional:  Negative for chills, diaphoresis, fever, malaise/fatigue and weight loss.  HENT:  Negative for nosebleeds and sore throat.   Eyes:  Negative for double vision.  Respiratory:  Negative for cough, hemoptysis, sputum production, shortness of breath and wheezing.   Cardiovascular:  Negative for chest pain, palpitations, orthopnea and leg swelling.  Gastrointestinal:  Negative for abdominal pain, blood in stool, constipation, diarrhea, heartburn, melena, nausea and vomiting.  Genitourinary:  Negative for dysuria, frequency and urgency.  Musculoskeletal:  Negative for back pain and joint pain.  Skin: Negative.  Negative for itching and rash.  Neurological:  Negative for dizziness, tingling, focal weakness, weakness and headaches.  Endo/Heme/Allergies:  Does not bruise/bleed easily.  Psychiatric/Behavioral:  Negative for depression. The patient is not nervous/anxious and does not have insomnia.     MEDICAL HISTORY:  Past Medical History:  Diagnosis Date   B12 deficiency    COVID-19    02/26/20 fatigue x 5 days lost taste and smell    Leukopenia    Migraines    since childhood   Seborrheic keratosis     SURGICAL HISTORY: Past Surgical History:  Procedure Laterality Date   BREAST BIOPSY Right 2014   stereo- neg   BREAST BIOPSY Right 07/26/2018    distortion bx with affirm, x marker, Complex sclerosing lesion, Stromal Fibrosis and Sclerosing Adenosis   COLONOSCOPY WITH PROPOFOL N/A 08/01/2018   Procedure: COLONOSCOPY WITH PROPOFOL;  Surgeon: Virgel Manifold, MD;  Location: [facility] ENDOSCOPY;  Service: Endoscopy;  Laterality: N/A;   NO PAST SURGERIES      SOCIAL HISTORY: Social History   Socioeconomic History   Marital status: Married    Spouse name: Not on file   Number of children: Not on file   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 No smoking.    Social Determinants of Health   Financial Resource Strain: Not on file  Food Insecurity: Not on file  Transportation Needs: Not on file  Physical Activity: Not on file  Stress: Not on file  Social Connections: Not on file  Intimate Partner Violence: Not on file    FAMILY HISTORY: Family History  Problem Relation Age of  Onset   Heart disease Mother    Heart disease Father        mi   Hyperlipidemia Father    Hypertension Father    AAA (abdominal aortic aneurysm) Father        ruptured died 55   Breast cancer Sister 55   Migraines Sister    Cancer Sister        cervical age 71 y.o    Ankylosing spondylitis Sister    Stroke Maternal Grandmother    Headache Maternal Grandmother    Other Nephew        GIST tumor     ALLERGIES:  has No Known  Allergies.  MEDICATIONS:  Current Outpatient Medications  Medication Sig Dispense Refill   Atogepant (QULIPTA) 60 MG TABS Take 60 mg by mouth daily. 30 tablet 5   SUMAtriptan (IMITREX) 100 MG tablet Take 1 tablet (100 mg total) by mouth once as needed for up to 1 dose. May repeat in 2 hours if headache persists or recurs. 10 tablet 12   triamcinolone ointment (KENALOG) 0.1 % Apply to aa's rash BID up to one week. Avoid the face, groin, and axilla. 80 g 2   desonide (DESOWEN) 0.05 % cream Apply topically. (Patient not taking: Reported on 03/04/2021)     No current facility-administered medications for this visit.      PHYSICAL EXAMINATION:  Vitals:   04/27/21 1531  BP: 113/73  Pulse: (!) 53  Resp: 16  Temp: (!) 97.4 F (36.3 C)   Filed Weights   04/27/21 1531  Weight: 134 lb (60.8 kg)    Physical Exam Vitals and nursing note reviewed.  Constitutional:      Comments: Ambulating: Independently.  Accompanied: none.  HENT:     Head: Normocephalic and atraumatic.     Mouth/Throat:     Pharynx: Oropharynx is clear.  Eyes:     Extraocular Movements: Extraocular movements intact.     Pupils: Pupils are equal, round, and reactive to light.  Cardiovascular:     Rate and Rhythm: Normal rate and regular rhythm.  Pulmonary:     Comments: Decreased breath sounds bilaterally.  Abdominal:     Palpations: Abdomen is soft.  Musculoskeletal:        General: Normal range of motion.     Cervical back: Normal range of motion.  Skin:    General: Skin is warm.  Neurological:     General: No focal deficit present.     Mental Status: She is alert and oriented to person, place, and time.  Psychiatric:        Behavior: Behavior normal.        Judgment: Judgment normal.     LABORATORY DATA:  I have reviewed the data as listed Lab Results  Component Value Date   WBC 4.6 04/27/2021   HGB 14.3 04/27/2021   HCT 42.4 04/27/2021   MCV 97.5 04/27/2021   PLT 302 04/27/2021   Recent  Labs    08/20/20 0802 10/30/20 1201 04/27/21 1514  NA 138 139 137  K 4.4 4.1 4.2  CL 103 103 98  CO2 29 26 28   GLUCOSE 93 139* 88  BUN 9 10 12   CREATININE 0.61 0.61 0.61  CALCIUM 9.6 9.1 9.4  GFRNONAA  --  >60 >60  PROT 7.1 7.1 7.8  ALBUMIN 4.3 4.2 4.5  AST 25 33 28  ALT 15 21 20   ALKPHOS 85 81 70  BILITOT 0.6 0.4 0.5  RADIOGRAPHIC STUDIES: I have personally reviewed the radiological images as listed and agreed with the findings in the report. No results found.  ASSESSMENT & PLAN:   Leucopenia #Leukopenia/neutropenia- absolute neutrophil count -1000; normal hemoglobin/platelets.  Intermittent.  Likely viral infection versus other benign causes.  Patient asymptomatic.  #Today white count is 4.2.  ANC 2.6.  Hold off any further work-up.  Patient continues to be asymptomatic   # B12 def [vegan]-on B12 injections.  I think is reasonable to continue oral B12.    #Since patient is clinically stable I think is reasonable for the patient to follow-up with PCP/can follow-up with [place] as needed.  Patient comfortable with the plan; to call us if any questions or concerns in the interim.   # DISPOSITION: # follow up as needed-Dr.B   All questions were answered. The patient knows to call the clinic with any problems, questions or concerns.    Cammie Sickle, MD 04/27/2021 3:46 PM

## 2021-04-27 NOTE — Telephone Encounter (Signed)
Received from [facility] that needed more clinical information.  (Episodic migraines.  4-14 migraines per month (no more then 14 per month.  No other CRGP.  Tried ajovy, elavil, topiramate, amitriptyline, sumatriptan, motrin. Faxed back. Awaiting determination.

## 2021-04-30 ENCOUNTER — Other Ambulatory Visit: Payer: BC Managed Care – PPO

## 2021-04-30 ENCOUNTER — Ambulatory Visit: Payer: BC Managed Care – PPO | Admitting: Internal Medicine

## 2021-05-07 ENCOUNTER — Other Ambulatory Visit: Payer: Self-pay

## 2021-05-07 ENCOUNTER — Ambulatory Visit: Payer: BC Managed Care – PPO

## 2021-05-07 ENCOUNTER — Ambulatory Visit
Admission: RE | Admit: 2021-05-07 | Discharge: 2021-05-07 | Disposition: A | Discharge status: Home / Self Care | Payer: BC Managed Care – PPO | Source: Ambulatory Visit | Attending: Emergency Medicine | Admitting: Emergency Medicine

## 2021-05-07 VITALS — BP 122/85 | HR 81 | Temp 97.7°F | Resp 18

## 2021-05-07 DIAGNOSIS — L03213 Periorbital cellulitis: Secondary | ICD-10-CM

## 2021-05-07 DIAGNOSIS — J0100 Acute maxillary sinusitis, unspecified: Secondary | ICD-10-CM

## 2021-05-07 MED ORDER — AMOXICILLIN-POT CLAVULANATE 875-125 MG PO TABS: 1.0000 | ORAL_TABLET | Freq: Two times a day (BID) | ORAL | 0 refills | Status: DC

## 2021-05-07 NOTE — Discharge Instructions (Addendum)
Take the Augmentin as directed.  Follow up with your primary care provider if your symptoms are not improving.    

## 2021-05-07 NOTE — ED Provider Notes (Signed)
Veronica Henderson    CSN: [id number] Arrival date & time: 05/07/21  2263      History   Chief Complaint Chief Complaint  Patient presents with   Sinus Pressure     HPI Veronica Henderson is a 55 y.o. female.  Patient presents fatigue, sinus congestion, runny nose, cough x 5 days.  She also reports left eyelid redness and swelling since yesterday; crusted with drainage this morning.  No eye trauma.  No eye pain or changes in vision.  No fever, shortness of breath, vomiting, diarrhea, or other symptoms.  Treatment at home with Sudafed.  Her medical history includes leukopenia, migraine headaches, B12 deficiency.  The history is provided by the patient and medical records.   Past Medical History:  Diagnosis Date   B12 deficiency    COVID-19    02/26/20 fatigue x 5 days lost taste and smell    Leukopenia    Migraines    since childhood   Seborrheic keratosis     Patient Active Problem List   Diagnosis Date Noted   Leucopenia 10/30/2020   Elevated TSH 08/20/2020   B12 deficiency 08/20/2020   Chronic migraine without aura without status migrainosus, not intractable 02/25/2019   Abnormal mammogram 09/11/2018   Encounter for screening colonoscopy    Annual physical exam 07/18/2018   Migraines 02/27/2017    Past Surgical History:  Procedure Laterality Date   BREAST BIOPSY Right 2014   stereo- neg   BREAST BIOPSY Right 07/26/2018   distortion bx with affirm, x marker, Complex sclerosing lesion, Stromal Fibrosis and Sclerosing Adenosis   COLONOSCOPY WITH PROPOFOL N/A 08/01/2018   Procedure: COLONOSCOPY WITH PROPOFOL;  Surgeon: Virgel Manifold, MD;  Location: [facility] ENDOSCOPY;  Service: Endoscopy;  Laterality: N/A;   NO PAST SURGERIES      OB History   No obstetric history on file.      Home Medications    Prior to Admission medications   Medication Sig Start Date End Date Taking? Authorizing Provider  amoxicillin-clavulanate (AUGMENTIN) 875-125 MG tablet Take 1  tablet by mouth every 12 (twelve) hours. 05/07/21  Yes Sharion Balloon, NP  Atogepant (QULIPTA) 60 MG TABS Take 60 mg by mouth daily. 04/27/21   Melvenia Beam, MD  desonide (DESOWEN) 0.05 % cream Apply topically. Patient not taking: Reported on 03/04/2021 10/21/20   [provider]  SUMAtriptan (IMITREX) 100 MG tablet Take 1 tablet (100 mg total) by mouth once as needed for up to 1 dose. May repeat in 2 hours if headache persists or recurs. 12/17/20   Melvenia Beam, MD  triamcinolone ointment (KENALOG) 0.1 % Apply to aa's rash BID up to one week. Avoid the face, groin, and axilla. 03/04/21   Moye, [place], MD    Family History Family History  Problem Relation Age of Onset   Heart disease Mother    Heart disease Father        mi   Hyperlipidemia Father    Hypertension Father    AAA (abdominal aortic aneurysm) Father        ruptured died 41   Breast cancer Sister 8   Migraines Sister    Cancer Sister        cervical age 40 y.o    Ankylosing spondylitis Sister    Stroke Maternal Grandmother    Headache Maternal Grandmother    Other Nephew        GIST tumor     Social History Social History  Tobacco Use   Smoking status: Never   Smokeless tobacco: Never  Vaping Use   Vaping Use: Never used  Substance Use Topics   Alcohol use: Yes    Alcohol/week: 1.0 - 2.0 standard drink    Types: 1 - 2 Cans of beer per week   Drug use: No     Allergies   Patient has no known allergies.   Review of Systems Review of Systems  Constitutional:  Positive for fatigue. Negative for chills and fever.  HENT:  Positive for congestion, postnasal drip, rhinorrhea and sinus pressure. Negative for ear pain and sore throat.   Eyes:  Positive for discharge and redness. Negative for pain and visual disturbance.  Respiratory:  Positive for cough. Negative for shortness of breath.   Cardiovascular:  Negative for chest pain and palpitations.  Gastrointestinal:  Negative for diarrhea and  vomiting.  Skin:  Negative for color change and rash.  All other systems reviewed and are negative.   Physical Exam Triage Vital Signs ED Triage Vitals  Enc Vitals Group     BP 05/07/21 0901 122/85     Pulse Rate 05/07/21 0901 81     Resp 05/07/21 0901 18     Temp 05/07/21 0901 97.7 F (36.5 C)     Temp Source 05/07/21 0901 Oral     SpO2 05/07/21 0901 98 %     Weight --      Height --      Head Circumference --      Peak Flow --      Pain Score 05/07/21 0904 0     Pain Loc --      Pain Edu? --      Excl. in [place]? --    No data found.  Updated Vital Signs BP 122/85 (BP Location: Left Arm)    Pulse 81    Temp 97.7 F (36.5 C) (Oral)    Resp 18    LMP 06/06/2012 (Approximate)    SpO2 98%   Visual Acuity Right Eye Distance:   Left Eye Distance:   Bilateral Distance:    Right Eye Near:   Left Eye Near:    Bilateral Near:     Physical Exam Vitals and nursing note reviewed.  Constitutional:      General: She is not in acute distress.    Appearance: She is well-developed.  HENT:     Right Ear: Tympanic membrane normal.     Left Ear: Tympanic membrane normal.     Nose: Congestion and rhinorrhea present.     Mouth/Throat:     Mouth: Mucous membranes are moist.     Pharynx: Oropharynx is clear.  Eyes:     Extraocular Movements: Extraocular movements intact.     Conjunctiva/sclera:     Right eye: Right conjunctiva is not injected.     Left eye: Left conjunctiva is injected.     Pupils: Pupils are equal, round, and reactive to light.     Comments: Left eyelids erythematous with mild edema. No drainage.   Cardiovascular:     Rate and Rhythm: Normal rate and regular rhythm.     Heart sounds: Normal heart sounds.  Pulmonary:     Effort: Pulmonary effort is normal. No respiratory distress.     Breath sounds: Normal breath sounds.  Musculoskeletal:     Cervical back: Neck supple.  Skin:    General: Skin is warm and dry.  Neurological:     General: No focal  deficit  present.     Mental Status: She is alert and oriented to person, place, and time.  Psychiatric:        Mood and Affect: Mood normal.        Behavior: Behavior normal.     UC Treatments / Results  Labs (all labs ordered are listed, but only abnormal results are displayed) Labs Reviewed - No data to display  EKG   Radiology No results found.  Procedures Procedures (including critical care time)  Medications Ordered in UC Medications - No data to display  Initial Impression / Assessment and Plan / UC Course  I have reviewed the triage vital signs and the nursing notes.  Pertinent labs & imaging results that were available during my care of the patient were reviewed by me and considered in my medical decision making (see chart for details).   Left preseptal cellulitis, acute sinusitis.  Treating with Augmentin.  Discussed symptomatic treatment with ibuprofen and plain Mucinex.  Education provided on preseptal cellulitis and sinusitis.  ED precautions discussed.  Instructed patient to follow up with her PCP if her symptoms are not improving.  She agrees to plan of care.    Final Clinical Impressions(s) / UC Diagnoses   Final diagnoses:  Preseptal cellulitis of left eye  Acute non-recurrent maxillary sinusitis     Discharge Instructions      Take the Augmentin as directed.   Follow up with your primary care provider if your symptoms are not improving.         ED Prescriptions     Medication Sig Dispense Auth. Provider   amoxicillin-clavulanate (AUGMENTIN) 875-125 MG tablet Take 1 tablet by mouth every 12 (twelve) hours. 14 tablet Sharion Balloon, NP      PDMP not reviewed this encounter.   Sharion Balloon, NP 05/07/21 [phone]

## 2021-05-07 NOTE — ED Triage Notes (Addendum)
Pt c/o sinus pressure, cough, fatigue and chest congestion x 5 days. Her left eye was draining and red this morning.

## 2021-05-10 NOTE — Telephone Encounter (Signed)
Called and approval thru 10-25-2021 qulipta 60mg . ([facility] [phone]).

## 2021-05-26 NOTE — Telephone Encounter (Signed)
I called pt and she has gotten the qulipta, she is doing well.

## 2021-08-10 ENCOUNTER — Other Ambulatory Visit: Payer: Self-pay

## 2021-08-10 DIAGNOSIS — E559 Vitamin D deficiency, unspecified: Secondary | ICD-10-CM

## 2021-08-10 DIAGNOSIS — E538 Deficiency of other specified B group vitamins: Secondary | ICD-10-CM

## 2021-08-10 DIAGNOSIS — R946 Abnormal results of thyroid function studies: Secondary | ICD-10-CM

## 2021-08-10 DIAGNOSIS — Z13220 Encounter for screening for lipoid disorders: Secondary | ICD-10-CM

## 2021-08-10 DIAGNOSIS — Z1389 Encounter for screening for other disorder: Secondary | ICD-10-CM

## 2021-08-10 DIAGNOSIS — E611 Iron deficiency: Secondary | ICD-10-CM

## 2021-08-10 DIAGNOSIS — Z1329 Encounter for screening for other suspected endocrine disorder: Secondary | ICD-10-CM

## 2021-08-10 DIAGNOSIS — Z0000 Encounter for general adult medical examination without abnormal findings: Secondary | ICD-10-CM

## 2021-08-24 ENCOUNTER — Encounter: Payer: Self-pay | Admitting: Internal Medicine

## 2021-08-24 ENCOUNTER — Encounter: Payer: BC Managed Care – PPO | Admitting: Internal Medicine

## 2021-08-24 ENCOUNTER — Telehealth (INDEPENDENT_AMBULATORY_CARE_PROVIDER_SITE_OTHER): Payer: BC Managed Care – PPO | Admitting: Internal Medicine

## 2021-08-24 DIAGNOSIS — J029 Acute pharyngitis, unspecified: Secondary | ICD-10-CM | POA: Diagnosis not present

## 2021-08-24 DIAGNOSIS — R509 Fever, unspecified: Secondary | ICD-10-CM

## 2021-08-24 DIAGNOSIS — R0989 Other specified symptoms and signs involving the circulatory and respiratory systems: Secondary | ICD-10-CM

## 2021-08-24 DIAGNOSIS — E611 Iron deficiency: Secondary | ICD-10-CM | POA: Insufficient documentation

## 2021-08-24 DIAGNOSIS — J0110 Acute frontal sinusitis, unspecified: Secondary | ICD-10-CM | POA: Diagnosis not present

## 2021-08-24 DIAGNOSIS — J40 Bronchitis, not specified as acute or chronic: Secondary | ICD-10-CM

## 2021-08-24 DIAGNOSIS — Z1231 Encounter for screening mammogram for malignant neoplasm of breast: Secondary | ICD-10-CM

## 2021-08-24 MED ORDER — AMOXICILLIN-POT CLAVULANATE 875-125 MG PO TABS: 1.0000 | ORAL_TABLET | Freq: Two times a day (BID) | ORAL | 0 refills | Status: DC

## 2021-08-24 NOTE — Patient Instructions (Signed)
?  These are over the counter medication options:  ?Mucinex dm green label for cough or robitussin DM  ?Multivitamin or below vitamins  ?Vitamin C 1000 mg daily.  ?Vitamin D3 4000 Iu (units) daily.  ?Zinc 100 mg daily.  ?Quercetin 250-500 mg 2 times per day   ?Elderberry  ?Oil of oregano  ?cepacol or chloroseptic spray ?Warm salt water gargles +hydrogen peroxide ?Sugar free cough drops  ?Warm tea with honey and lemon  ?Hydration  ?Try to eat though you dont feel like it   ?Tylenol or Advil  ?Nasal saline and Flonase 2 sprays nasal congestion  ?If sneezing/runny nose over the counter allergy pill claritin,allegra, zyrtec, xyzal ?Quarantine x 10-14 days 14 days preferred  ? ?Monitor pulse oximeter, buy from [employer] if oxygen is less than 90 please go to the hospital.  ?   ?   ?Are you feeling really sick? Shortness of breath, cough, chest pain?, dizziness? Confusion  ? If so let me know  ?If worsening, go to hospital or [facility] Urgent care for further treatment.    ?

## 2021-08-24 NOTE — Addendum Note (Signed)
Addended by: Orland Mustard on: 08/24/2021 12:45 PM ? ? Modules accepted: Orders ? ?

## 2021-08-24 NOTE — Progress Notes (Signed)
Telephone Note ? ?I connected with Rowan Blase ? on 08/24/21 at 11:40 AM EDT by telephone and verified that I am speaking with the correct person using two identifiers. ? Location patient: [place] ?Location provider:work or home office ?Persons participating in the virtual visit: patient, provider ? ?I discussed the limitations and requested verbal permission for telemedicine visit. The patient expressed understanding and agreed to proceed. ? ? ?HPI: ? ?Acute telemedicine visit for : ?URI started Friday and yesterday worse with chest congestion productive yellow phelgm sore throat fever 100 covid home test negative no sick exposure  ? ?-Pertinent past medical history: see below ?-Pertinent medication allergies:No Known Allergies ?-COVID-19 vaccine status:  ?Immunization History  ?Administered Date(s) Administered  ? PFIZER(Purple Top)SARS-COV-2 Vaccination 08/07/2019, 09/01/2019, 03/16/2020  ? Pension scheme manager 38yr & up 05/31/2021  ? Zoster Recombinat (Shingrix) 12/23/2020, 07/02/2021  ? ? ? ?ROS: See pertinent positives and negatives per HPI. ? ?Past Medical History:  ?Diagnosis Date  ? B12 deficiency   ? COVID-19   ? 02/26/20 fatigue x 5 days lost taste and smell   ? Leukopenia   ? Migraines   ? since childhood  ? Seborrheic keratosis   ? ? ?Past Surgical History:  ?Procedure Laterality Date  ? BREAST BIOPSY Right 2014  ? stereo- neg  ? BREAST BIOPSY Right 07/26/2018  ? distortion bx with affirm, x marker, Complex sclerosing lesion, Stromal Fibrosis and Sclerosing Adenosis  ? COLONOSCOPY WITH PROPOFOL N/A 08/01/2018  ? Procedure: COLONOSCOPY WITH PROPOFOL;  Surgeon: TVirgel Manifold MD;  Location: [facility] ENDOSCOPY;  Service: Endoscopy;  Laterality: N/A;  ? NO PAST SURGERIES    ? ? ? ?Current Outpatient Medications:  ?  amoxicillin-clavulanate (AUGMENTIN) 875-125 MG tablet, Take 1 tablet by mouth 2 (two) times daily. With food, Disp: 14 tablet, Rfl: 0 ?  Atogepant (QULIPTA) 60 MG TABS, Take 60  mg by mouth daily., Disp: 30 tablet, Rfl: 5 ?  desonide (DESOWEN) 0.05 % cream, Apply topically., Disp: , Rfl:  ?  SUMAtriptan (IMITREX) 100 MG tablet, Take 1 tablet (100 mg total) by mouth once as needed for up to 1 dose. May repeat in 2 hours if headache persists or recurs., Disp: 10 tablet, Rfl: 12 ?  triamcinolone ointment (KENALOG) 0.1 %, Apply to aa's rash BID up to one week. Avoid the face, groin, and axilla., Disp: 80 g, Rfl: 2 ? ?EXAM: ? ?VITALS per patient if applicable: ? ?GENERAL: alert, oriented, appears well and in no acute distress ? ?HEENT: atraumatic, conjunttiva clear, no obvious abnormalities on inspection of external nose and ears ? ? ?PSYCH/NEURO: pleasant and cooperative, no obvious depression or anxiety, speech and thought processing grossly intact ? ?ASSESSMENT AND PLAN: ? ?Discussed the following assessment and plan: ? ?Sore throat - Plan: amoxicillin-clavulanate (AUGMENTIN) 875-125 MG tablet bid x 14 days  ?Screening flu, strep, covid  ?Declines tussionex for now  ?Taking mucinex  ? ?These are over the counter medication options:  ?Mucinex dm green label for cough or robitussin DM  ?Multivitamin or below vitamins  ?Vitamin C 1000 mg daily.  ?Vitamin D3 4000 Iu (units) daily.  ?Zinc 100 mg daily.  ?Quercetin 250-500 mg 2 times per day   ?Elderberry  ?Oil of oregano  ?cepacol or chloroseptic spray ?Warm salt water gargles +hydrogen peroxide ?Sugar free cough drops  ?Warm tea with honey and lemon  ?Hydration  ?Try to eat though you dont feel like it   ?Tylenol or Advil  ?Nasal saline and  Flonase 2 sprays nasal congestion  ?If sneezing/runny nose over the counter allergy pill claritin,allegra, zyrtec, xyzal ?Quarantine x 10-14 days 14 days preferred  ? ?   ?   ?Are you feeling really sick? Shortness of breath, cough, chest pain?, dizziness? Confusion  ? If so let me know  ?If worsening, go to hospital or [facility] clinic Urgent care for further treatment.    ? ?HM ?Declines flu shot  ?covid 3/3  utd ?Tdap had 01/2019  ?Hep C/HIV declines ?Check hep B/MMR not protected disc vaccines today ?  ?Mammogram had solix 08/24/20 ordered again  ? ?Colonoscopy 08/01/2018 nl f/u in 10 years ?  ?Pap 07/18/2018 negative ?-pap due  ? ? neg HIV  ?  ?Dermatology no need for now eczema right forearm using vasoline for now  ?rec healthy diet and exercise she is vegan ? ?-we discussed possible serious and likely etiologies, options for evaluation and workup, limitations of telemedicine visit vs in person visit, treatment, treatment risks and precautions. Pt is agreeable to treatment via telemedicine at this moment.  ? ? ?I discussed the assessment and treatment plan with the patient. The patient was provided an opportunity to ask questions and all were answered. The patient agreed with the plan and demonstrated an understanding of the instructions. ?  ? ?Time spent 20 minutes ?Nino Glow McLean-Scocuzza, MD   ?

## 2021-08-25 LAB — NOVEL CORONAVIRUS, NAA: SARS-CoV-2, NAA: NOT DETECTED

## 2021-08-26 LAB — POCT INFLUENZA A/B
Influenza A, POC: NEGATIVE
Influenza B, POC: NEGATIVE

## 2021-08-26 LAB — POCT RAPID STREP A (OFFICE): Rapid Strep A Screen: NEGATIVE

## 2021-09-24 DIAGNOSIS — M25531 Pain in right wrist: Secondary | ICD-10-CM | POA: Diagnosis not present

## 2021-10-17 ENCOUNTER — Other Ambulatory Visit: Payer: Self-pay | Admitting: Neurology

## 2021-11-16 ENCOUNTER — Other Ambulatory Visit: Payer: Self-pay | Admitting: Internal Medicine

## 2021-11-16 ENCOUNTER — Other Ambulatory Visit: Payer: Self-pay | Admitting: Neurology

## 2021-11-16 DIAGNOSIS — J029 Acute pharyngitis, unspecified: Secondary | ICD-10-CM

## 2021-11-16 DIAGNOSIS — J40 Bronchitis, not specified as acute or chronic: Secondary | ICD-10-CM

## 2021-11-16 DIAGNOSIS — R0989 Other specified symptoms and signs involving the circulatory and respiratory systems: Secondary | ICD-10-CM

## 2021-11-26 ENCOUNTER — Other Ambulatory Visit: Payer: Self-pay | Admitting: Neurology

## 2021-11-29 ENCOUNTER — Telehealth: Payer: Self-pay | Admitting: Neurology

## 2021-11-29 MED ORDER — QULIPTA 60 MG PO TABS: 1.0000 | ORAL_TABLET | Freq: Every day | ORAL | 0 refills | Status: DC

## 2021-11-29 NOTE — Telephone Encounter (Signed)
One [id number].

## 2021-11-29 NOTE — Telephone Encounter (Signed)
Pt request refill for [facility]IPTA [facility] MG TABS [id number]  Pt scheduled appt on 11/30/21 with Amy, NP

## 2021-11-30 ENCOUNTER — Ambulatory Visit: Payer: BC Managed Care – PPO | Admitting: Family Medicine

## 2021-11-30 ENCOUNTER — Encounter: Payer: Self-pay | Admitting: Family Medicine

## 2021-11-30 ENCOUNTER — Telehealth: Payer: Self-pay | Admitting: *Deleted

## 2021-11-30 VITALS — BP 101/65 | HR 79 | Ht 65.0 in | Wt 135.0 lb

## 2021-11-30 DIAGNOSIS — G43909 Migraine, unspecified, not intractable, without status migrainosus: Secondary | ICD-10-CM

## 2021-11-30 MED ORDER — NORTRIPTYLINE HCL 25 MG PO CAPS: 25.0000 mg | ORAL_CAPSULE | Freq: Every day | ORAL | 1 refills | Status: DC

## 2021-11-30 MED ORDER — SUMATRIPTAN SUCCINATE 100 MG PO TABS: 100.0000 mg | ORAL_TABLET | Freq: Once | ORAL | 11 refills | Status: DC | PRN

## 2021-11-30 MED ORDER — QULIPTA 60 MG PO TABS: 1.0000 | ORAL_TABLET | Freq: Every day | ORAL | 3 refills | Status: DC

## 2021-11-30 NOTE — Telephone Encounter (Signed)
Qulipta PA< Key: [id number] . Your information has been sent to [facility].

## 2021-11-30 NOTE — Progress Notes (Signed)
PATIENT: Veronica Henderson DOB: 12-Jul-1965  REASON FOR VISIT: follow up HISTORY FROM: patient  Chief Complaint  Patient presents with   Follow-up    Pt alone, rm 7. Presents for follow up. She feels the [place] isn't as effective as initially was. She is finding she is having to use the sumatriptan more often.      HISTORY OF PRESENT ILLNESS:  11/30/21 ALL: Veronica Henderson returns for follow up for migraines. She was last seen by me in 08/2019 and continued on amitriptyline and sumatriptan. She saw Dr Jaynee Eagles 11/2020 and switched to topiramate and sumatriptan. She called in 01/2021 reporting topiramate was ineffective and swithced to [place]. Sumatriptan was increased from 50 to '100mg'$  PRN. Since, she reports that headaches are fairly well managed. She feels that Suzan Nailer has been the best preventative thus far. She reports having about 8-10 headache days a month. Last month was worse. Most are not severe. She usually takes 1/2 tablet of sumatriptan.   Medications tried and failed: Qulipta (on now), Ajovy (ineffective and didn't like injections, amitriptyline (lost effectiveness), topiramate (not effective)  09/03/19 ALL (Mychart): Veronica Henderson is a 56 y.o. female here today for follow up of headaches/migraines. She was started on amitriptyline '10mg'$  at bedtime in 05/2018. She reports that migraine frequency has reduced. She may have 5 migraines per month that are easily aborted with sumatriptan. She is tolerating medications well, although, she does note sleepiness with amitriptyline. She usually takes medication around 7:30 pm and feels side effects are mild. She is resting better.   06/05/2019 ALL: Veronica Henderson is a 56 y.o. female here today for follow up for migraines. She was started on Ajovy and Imitrex. She is unsure if headaches have improved. She has taken 4 Ajovy injections. She does not feel that headaches have changed at all. Pain is usually unilateral, retro orbital but sometimes starts in the back of  her head. She gets sound sensitivity and nausea. No sensitivity to light. She has not identified triggers. She does feel that she gets anxious about headaches but is generally not an anxious person. She sleeps fairly well. No snoring. Usually 7-8 hours a night.   HISTORY: (copied from Dr Cathren Laine note on 02/25/2019)  HPI:  Veronica Henderson is a 56 y.o. female here as requested by McLean-Scocuzza, Olivia Mackie * for migraines.  Past medical history migraines since childhood.  She has a family history in her sister and maternal grandmother. She has had headaches since a child. She knows when the headache is coming and if she doesn't have imitrex it can last days. No aura. o vision changes. It is in the right eye, behind the eye, can spread to the other side, severe, pounding pulsating, severe, no aura. Last week she had a migraine 4 days in a row. She took 4 imitrex pills last week. She gets anxious thinking about getting a headache. She is having migraines more frequently. Beer can trigger. The migraines can rebound back. She has 10 headache days a month. OTC doesn't work. She has new pain in the occipital area, and she can wake up the migraines.No other focal neurologic deficits, associated symptoms, inciting events or modifiable factors.    No other focal neurologic deficits, associated symptoms, inciting events or modifiable factors.    Reviewed notes, labs and imaging from outside physicians, which showed:   CBC with low white blood cells 3.5 otherwise normal, February 2020.  CMP was normal in January 2020.   I reviewed McLean-Scocuzza, Nino Glow,  MD's notes.  Patient was seen in January of this year, she has had chronic migraines since a kid lasting 3 days, using 18 Imitrex daily over-the-counter medications.  She feels increased anxiety before the headache mostly they are in the right eye.  She has nausea vomiting, phonophobia, no significant photophobia, she is on ophthalmologist and per patient her eyes were  okay, 2 to 3 cups of coffee a day, her sister and maternal grandmother had migraines which stopped at an older age 88s.  Sleeping 7 to 8 hours a night.  She followed up with physician again in February of this year and followed for leukopenia.  REVIEW OF SYSTEMS: Out of a complete 14 system review of symptoms, the patient complains only of the following symptoms, headaches and all other reviewed systems are negative.  ALLERGIES: No Known Allergies  HOME MEDICATIONS: Outpatient Medications Prior to Visit  Medication Sig Dispense Refill   desonide (DESOWEN) 0.05 % cream Apply topically.     triamcinolone ointment (KENALOG) 0.1 % Apply to aa's rash BID up to one week. Avoid the face, groin, and axilla. 80 g 2   Atogepant (QULIPTA) 60 MG TABS Take 1 tablet by mouth daily. 30 tablet 0   SUMAtriptan (IMITREX) 100 MG tablet Take 1 tablet (100 mg total) by mouth once as needed for up to 1 dose. May repeat in 2 hours if headache persists or recurs. 10 tablet 12   amoxicillin-clavulanate (AUGMENTIN) 875-125 MG tablet Take 1 tablet by mouth 2 (two) times daily. With food 14 tablet 0   No facility-administered medications prior to visit.    PAST MEDICAL HISTORY: Past Medical History:  Diagnosis Date   B12 deficiency    COVID-19    02/26/20 fatigue x 5 days lost taste and smell    Leukopenia    Migraines    since childhood   Seborrheic keratosis     PAST SURGICAL HISTORY: Past Surgical History:  Procedure Laterality Date   BREAST BIOPSY Right 2014   stereo- neg   BREAST BIOPSY Right 07/26/2018   distortion bx with affirm, x marker, Complex sclerosing lesion, Stromal Fibrosis and Sclerosing Adenosis   COLONOSCOPY WITH PROPOFOL N/A 08/01/2018   Procedure: COLONOSCOPY WITH PROPOFOL;  Surgeon: Virgel Manifold, MD;  Location: [facility] END[facility];  Service: Endoscopy;  Laterality: N/A;   NO PAST SURGERIES      FAMILY HISTORY: Family History  Problem Relation Age of Onset   Heart disease  Mother    Heart disease Father        mi   Hyperlipidemia Father    Hypertension Father    AAA (abdominal aortic aneurysm) Father        ruptured died 69   Breast cancer Sister 69   Migraines Sister    Cancer Sister        cervical age 68 y.o    Ankylosing spondylitis Sister    Stroke Maternal Grandmother    Headache Maternal Grandmother    Other Nephew        GIST tumor     SOCIAL HISTORY: Social History   Socioeconomic History   Marital status: Married    Spouse name: Not on file   Number of children: Not on file   Years of education: 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    Highest education level: High school graduate  Occupational History   Not on file  Tobacco Use   Smoking status: Never   Smokeless tobacco: Never  Vaping Use  Vaping Use: Never used  Substance and Sexual Activity   Alcohol use: Yes    Alcohol/week: 1.0 - 2.0 standard drink of alcohol    Types: 1 - 2 Cans of beer per week   Drug use: No   Sexual activity: Not on file  Other Topics Concern   Not on file  Social History Narrative   Married wife Wilhemena Durie; [place] from [place] in 2018 ; Never smoker; No guns, wears seat belt, safe in relationship; Self-employed- court steno-grapher; no alcohol. No smoking.    Social Determinants of Health   Financial Resource Strain: Not on file  Food Insecurity: Not on file  Transportation Needs: Not on file  Physical Activity: Not on file  Stress: Not on file  Social Connections: Not on file  Intimate Partner Violence: Not on file      PHYSICAL EXAM  Vitals:   11/30/21 1126  BP: 101/65  Pulse: 79  Weight: 135 lb (61.2 kg)  Height: '5\' 5"'$  (1.651 m)    Body mass index is 22.47 kg/m.  Generalized: Well developed, in no acute distress  Cardiology: normal rate and rhythm, no murmur noted Respiratory: Clear to auscultation bilaterally Neurological examination  Mentation: Alert oriented to time, place, history taking. Follows all commands  speech and language fluent Cranial nerve II-XII: Pupils were equal round reactive to light. Extraocular movements were full, visual field were full on confrontational test. Facial sensation and strength were normal. Uvula tongue midline. Head turning and shoulder shrug  were normal and symmetric. Motor: The motor testing reveals 5 over 5 strength of all 4 extremities. Good symmetric motor tone is noted throughout.  Sensory: Sensory testing is intact to soft touch on all 4 extremities. No evidence of extinction is noted.  Coordination: Cerebellar testing reveals good finger-nose-finger and heel-to-shin bilaterally.  Gait and station: Gait is normal.  Reflexes: Deep tendon reflexes are symmetric and normal bilaterally.   DIAGNOSTIC DATA (LABS, IMAGING, TESTING) - I reviewed patient records, labs, notes, testing and imaging myself where available.      No data to display           Lab Results  Component Value Date   WBC 4.6 04/27/2021   HGB 14.3 04/27/2021   HCT 42.4 04/27/2021   MCV 97.5 04/27/2021   PLT 302 04/27/2021      Component Value Date/Time   NA 137 04/27/2021 1514   K 4.2 04/27/2021 1514   CL 98 04/27/2021 1514   CO2 28 04/27/2021 1514   GLUCOSE 88 04/27/2021 1514   BUN 12 04/27/2021 1514   CREATININE 0.61 04/27/2021 1514   CALCIUM 9.4 04/27/2021 1514   PROT 7.8 04/27/2021 1514   ALBUMIN 4.5 04/27/2021 1514   AST 28 04/27/2021 1514   ALT 20 04/27/2021 1514   ALKPHOS 70 04/27/2021 1514   BILITOT 0.5 04/27/2021 1514   GFRNONAA >60 04/27/2021 1514   Lab Results  Component Value Date   CHOL 184 08/20/2020   HDL 64.80 08/20/2020   LDLCALC 106 (H) 08/20/2020   TRIG 66.0 08/20/2020   CHOLHDL 3 08/20/2020   No results found for: "HGBA1C" Lab Results  Component Value Date   WGNFAOZH08 657 10/02/2020   Lab Results  Component Value Date   TSH 4.14 10/02/2020     ASSESSMENT AND PLAN 56 y.o. year old female  has a past medical history of B12 deficiency,  COVID-19, Leukopenia, Migraines, and Seborrheic keratosis. here with     ICD-10-CM   1.  Migraine without status migrainosus, not intractable, unspecified migraine type  G43.909        Sharah reports that headaches seem to be the best managed on Qulipta and sumatriptan. She does continue to have 8-10 headache days a month. Fortunately, most are mild. We have discussed options for prevention management. She prefers to add nortriptyline '25mg'$  QHS. Side effects and education materials provided in AVS. She will continue Qulipta '60mg'$  daily and sumatriptan as needed. Healthy lifestyle habits reviewed. She verbalizes understanding and agreement with this plan.   No orders of the defined types were placed in this encounter.    Meds ordered this encounter  Medications   Atogepant (QULIPTA) 60 MG TABS    Sig: Take 1 tablet by mouth daily.    Dispense:  90 tablet    Refill:  3    Order Specific Question:   Supervising Provider    Answer:   Melvenia Beam [[id number]]   SUMAtriptan (IMITREX) 100 MG tablet    Sig: Take 1 tablet (100 mg total) by mouth once as needed for up to 1 dose. May repeat in 2 hours if headache persists or recurs.    Dispense:  10 tablet    Refill:  11    Order Specific Question:   Supervising Provider    Answer:   Melvenia Beam [[id number]]   nortriptyline (PAMELOR) 25 MG capsule    Sig: Take 1 capsule (25 mg total) by mouth at bedtime.    Dispense:  90 capsule    Refill:  1    Order Specific Question:   Supervising Provider    Answer:   Melvenia Beam [[id number]]     YBO FBPZW, FNP-C 11/30/2021, 12:32 PM [facility] [address] [place], [place] [address] [phone]

## 2021-11-30 NOTE — Patient Instructions (Signed)
Below is our plan:  We will continue Qulipta '60mg'$  every day. I will add nortriptyline '25mg'$  at bedtime. Continue sumatriptan 50-'100mg'$  as needed. Avoid more than 10 doses each month.   Please make sure you are staying well hydrated. I recommend 50-60 ounces daily. Well balanced diet and regular exercise encouraged. Consistent sleep schedule with 6-8 hours recommended.   Please continue follow up with care team as directed.   Follow up with me in 6 months   You may receive a survey regarding today's visit. I encourage you to leave honest feed back as I do use this information to improve patient care. Thank you for seeing me today!

## 2021-11-30 NOTE — Telephone Encounter (Signed)
Qulipta approved, approval for this drug expires on 12/01/22. Faxed approval to pharmacy.

## 2021-12-30 ENCOUNTER — Telehealth: Payer: Self-pay | Admitting: Family Medicine

## 2021-12-30 NOTE — Telephone Encounter (Signed)
LVM and sent mychart msg informing pt of need to reschedule 1/15 appointment - Amy out

## 2022-05-30 ENCOUNTER — Other Ambulatory Visit: Payer: Self-pay

## 2022-05-30 MED ORDER — NORTRIPTYLINE HCL 25 MG PO CAPS: 25.0000 mg | ORAL_CAPSULE | Freq: Every day | ORAL | 0 refills | Status: DC

## 2022-06-06 ENCOUNTER — Ambulatory Visit: Payer: BC Managed Care – PPO | Admitting: Family Medicine

## 2022-11-15 ENCOUNTER — Telehealth: Payer: Self-pay

## 2022-11-15 NOTE — Telephone Encounter (Signed)
Pharmacy Patient Advocate Encounter   Received notification from [facility] that prior authorization for Qulipta 60MG  tablets is required/requested.   PA submitted to [facility] via CoverMyMeds Key or (Medicaid) confirmation # [id number] Status is pending

## 2022-11-16 ENCOUNTER — Other Ambulatory Visit (HOSPITAL_COMMUNITY): Payer: Self-pay

## 2022-11-16 NOTE — Telephone Encounter (Signed)
Pharmacy Patient Advocate Encounter  Prior Authorization for Qulipta 60MG  tablets has been APPROVED by [facility] from 11/15/2022 to 11/15/2023.   PA # PA Case ID: [id number]

## 2022-11-22 ENCOUNTER — Telehealth: Payer: Self-pay | Admitting: Family Medicine

## 2022-11-22 MED ORDER — QULIPTA 60 MG PO TABS: 1.0000 | ORAL_TABLET | Freq: Every day | ORAL | 0 refills | Status: DC

## 2022-11-22 NOTE — Telephone Encounter (Signed)
Please call patient and let her know   [phone] and was informed Qulipta 60 mg tablets needs refills, I will send in.  The sumatriptan 100 mg tablet is too soon to refill, she just picked up on 11/12/22 #9 tablets that is a 30 day supply.

## 2022-11-22 NOTE — Telephone Encounter (Signed)
Pt said she is trying to get refill on Atogepant (QULIPTA) 60 MG TABS  and SUMAtriptan (IMITREX) 100 MG tablet. [facility] to [facility].

## 2022-12-01 ENCOUNTER — Encounter: Payer: Self-pay | Admitting: Neurology

## 2022-12-13 ENCOUNTER — Encounter: Payer: Self-pay | Admitting: Neurology

## 2022-12-13 MED ORDER — SUMATRIPTAN SUCCINATE 100 MG PO TABS: 50.0000 mg | ORAL_TABLET | Freq: Once | ORAL | 2 refills | Status: DC | PRN

## 2023-01-31 ENCOUNTER — Encounter: Payer: Self-pay | Admitting: Neurology

## 2023-01-31 ENCOUNTER — Ambulatory Visit: Payer: BC Managed Care – PPO | Admitting: Neurology

## 2023-01-31 VITALS — BP 108/72 | HR 59 | Ht 65.0 in | Wt 141.0 lb

## 2023-01-31 DIAGNOSIS — G43709 Chronic migraine without aura, not intractable, without status migrainosus: Secondary | ICD-10-CM

## 2023-01-31 NOTE — Progress Notes (Signed)
PATIENT: Veronica Henderson DOB: 1965/11/14  REASON FOR VISIT: follow up HISTORY FROM: patient  Chief Complaint  Patient presents with   Follow-up    Pt in 4 Pt here for Migraines f/u  Pt states qulipta is not working well      HISTORY OF PRESENT ILLNESS:  02/03/2023 Veronica Henderson: She says Bennie Pierini is not effective, but she also didn't like when she was off of it. We discussed multiple options for laying onto her current medication as she still has 8 migraine days that are moderate to severe and > 15 total headache days a month and has tried multiple medications, injections. We discussed injections and she is averse to giving herself these medications, vyepti, botox which my NPs administer. No aura. No medication overuse. She prefers if we start botox.  Patient complains of symptoms per HPI as well as the following symptoms: migraines . Pertinent negatives and positives per HPI. All others negative  02/03/23 ALL: Veronica Henderson returns for follow up for migraines. She was last seen by me in 08/2019 and continued on amitriptyline and sumatriptan. She saw Veronica Henderson 11/2020 and switched to topiramate and sumatriptan. She called in 01/2021 reporting topiramate was ineffective and swithced to [place]. Sumatriptan was increased from 50 to 100mg  PRN. Since, she reports that headaches are fairly well managed. She feels that Veronica Henderson has been the best preventative thus far. She reports having about 8-10 headache days a month. Last month was worse. Most are not severe. She usually takes 1/2 tablet of sumatriptan.   Medications tried and failed: Qulipta (on now), Ajovy (ineffective and didn't like injections, amitriptyline (lost effectiveness), topiramate (not effective)  09/03/19 ALL (Mychart): Veronica Henderson is a 57 y.o. female here today for follow up of headaches/migraines. She was started on amitriptyline 10mg  at bedtime in 05/2018. She reports that migraine frequency has reduced. She may have 5 migraines per month that are  easily aborted with sumatriptan. She is tolerating medications well, although, she does note sleepiness with amitriptyline. She usually takes medication around 7:30 pm and feels side effects are mild. She is resting better.   06/05/2019 ALL: Veronica Henderson is a 57 y.o. female here today for follow up for migraines. She was started on Ajovy and Imitrex. She is unsure if headaches have improved. She has taken 4 Ajovy injections. She does not feel that headaches have changed at all. Pain is usually unilateral, retro orbital but sometimes starts in the back of her head. She gets sound sensitivity and nausea. No sensitivity to light. She has not identified triggers. She does feel that she gets anxious about headaches but is generally not an anxious person. She sleeps fairly well. No snoring. Usually 7-8 hours a night.   HISTORY: (copied from Veronica Henderson note on 02/25/2019)  HPI:  Veronica Henderson is a 57 y.o. female here as requested by Veronica Henderson, Veronica Henderson * for migraines.  Past medical history migraines since childhood.  She has a family history in her sister and maternal grandmother. She has had headaches since a child. She knows when the headache is coming and if she doesn't have imitrex it can last days. No aura. o vision changes. It is in the right eye, behind the eye, can spread to the other side, severe, pounding pulsating, severe, no aura. Last week she had a migraine 4 days in a row. She took 4 imitrex pills last week. She gets anxious thinking about getting a headache. She is having migraines more frequently. Beer can trigger. The  migraines can rebound back. She has 10 headache days a month. OTC doesn't work. She has new pain in the occipital area, and she can wake up the migraines.No other focal neurologic deficits, associated symptoms, inciting events or modifiable factors.    No other focal neurologic deficits, associated symptoms, inciting events or modifiable factors.    Reviewed notes, labs and  imaging from outside physicians, which showed:   CBC with low white blood cells 3.5 otherwise normal, February 2020.  CMP was normal in January 2020.   I reviewed Veronica Henderson, Veronica Spillers, MD's notes.  Patient was seen in January of this year, she has had chronic migraines since a kid lasting 3 days, using 18 Imitrex daily over-the-counter medications.  She feels increased anxiety before the headache mostly they are in the right eye.  She has nausea vomiting, phonophobia, no significant photophobia, she is on ophthalmologist and per patient her eyes were okay, 2 to 3 cups of coffee a day, her sister and maternal grandmother had migraines which stopped at an older age 60s.  Sleeping 7 to 8 hours a night.  She followed up with physician again in February of this year and followed for leukopenia.  REVIEW OF SYSTEMS: Out of a complete 14 system review of symptoms, the patient complains only of the following symptoms, headaches and all other reviewed systems are negative.  ALLERGIES: No Known Allergies  HOME MEDICATIONS: Outpatient Medications Prior to Visit  Medication Sig Dispense Refill   Atogepant (QULIPTA) 60 MG TABS Take 1 tablet (60 mg total) by mouth daily. 90 tablet 0   Cyanocobalamin (B-12 PO) Take by mouth.     SUMAtriptan (IMITREX) 100 MG tablet Take 0.5-1 tablets (50-100 mg total) by mouth once as needed for up to 1 dose. May repeat in 2 hours if headache persists or recurs. Do not exceed 200 mg in 24 hours. Do not exceed 10 doses per month. 10 tablet 2   desonide (DESOWEN) 0.05 % cream Apply topically.     nortriptyline (PAMELOR) 25 MG capsule Take 1 capsule (25 mg total) by mouth at bedtime. 90 capsule 0   triamcinolone ointment (KENALOG) 0.1 % Apply to aa's rash BID up to one week. Avoid the face, groin, and axilla. 80 g 2   No facility-administered medications prior to visit.    PAST MEDICAL HISTORY: Past Medical History:  Diagnosis Date   B12 deficiency    COVID-19     02/26/20 fatigue x 5 days lost taste and smell    Leukopenia    Migraines    since childhood   Seborrheic keratosis     PAST SURGICAL HISTORY: Past Surgical History:  Procedure Laterality Date   BREAST BIOPSY Right 2014   stereo- neg   BREAST BIOPSY Right 07/26/2018   distortion bx with affirm, x marker, Complex sclerosing lesion, Stromal Fibrosis and Sclerosing Adenosis   COLONOSCOPY WITH PROPOFOL N/A 08/01/2018   Procedure: COLONOSCOPY WITH PROPOFOL;  Surgeon: Veronica Spillers, MD;  Location: [facility] ENDOSCOPY;  Service: Endoscopy;  Laterality: N/A;   NO PAST SURGERIES      FAMILY HISTORY: Family History  Problem Relation Age of Onset   Heart disease Mother    Heart disease Father        mi   Hyperlipidemia Father    Hypertension Father    AAA (abdominal aortic aneurysm) Father        ruptured died 55   Breast cancer Sister 40   Migraines Sister  Cancer Sister        cervical age 17 y.o    Ankylosing spondylitis Sister    Stroke Maternal Grandmother    Headache Maternal Grandmother    Migraines Maternal Grandmother    Other Nephew        GIST tumor     SOCIAL HISTORY: Social History   Socioeconomic History   Marital status: Married    Spouse name: Not on file   Number of children: Not on file   Years of education: 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 No smoking.    Social Determinants of Health   Financial Resource Strain: Not  on file  Food Insecurity: Not on file  Transportation Needs: Not on file  Physical Activity: Not on file  Stress: Not on file  Social Connections: Not on file  Intimate Partner Violence: Not on file      PHYSICAL EXAM  Vitals:   01/31/23 0940  BP: 108/72  Pulse: (!) 59  Weight: 141 lb (64 kg)  Height: 5\' 5"  (1.651 m)    Body mass index is 23.46 kg/m.  Physical exam: Exam: Gen: NAD, conversant, well nourised, well groomed                     CV: RRR, no MRG. No Carotid Bruits. No peripheral edema, warm, nontender Eyes: Conjunctivae clear without exudates or hemorrhage  Neuro: Detailed Neurologic Exam  Speech:    Speech is normal; fluent and spontaneous with normal comprehension.  Cognition:    The patient is oriented to person, place, and time;     recent and remote memory intact;     language fluent;     normal attention, concentration,     fund of knowledge Cranial Nerves:    The pupils are equal, round, and reactive to light. No optic nerve head edema Visual fields are full to finger confrontation. Extraocular movements are intact. Trigeminal sensation is intact and the muscles of mastication are normal. The face is symmetric. The palate elevates in the midline. Hearing intact. Voice is normal. Shoulder shrug is normal. The tongue has normal motion without fasciculations.   Coordination: nml  Gait: nml  Motor Observation:    No asymmetry, no atrophy, and no involuntary movements noted. Tone:    Normal muscle tone.    Posture:    Posture is normal. normal erect    Strength:    Strength is V/V in the upper and lower limbs.      Sensation: intact to LT     Reflex Exam:  DTR's:    Deep tendon reflexes in the upper and lower extremities are symmetrical bilaterally.   Toes:    The toes are downgoing bilaterally.   Clonus:    Clonus is absent.   DIAGNOSTIC DATA (LABS, IMAGING, TESTING) - I reviewed patient records, labs, notes, testing and imaging  myself where  available.      No data to display           Lab Results  Component Value Date   WBC 4.6 04/27/2021   HGB 14.3 04/27/2021   HCT 42.4 04/27/2021   MCV 97.5 04/27/2021   PLT 302 04/27/2021      Component Value Date/Time   NA 137 04/27/2021 1514   K 4.2 04/27/2021 1514   CL 98 04/27/2021 1514   CO2 28 04/27/2021 1514   GLUCOSE 88 04/27/2021 1514   BUN 12 04/27/2021 1514   CREATININE 0.61 04/27/2021 1514   CALCIUM 9.4 04/27/2021 1514   PROT 7.8 04/27/2021 1514   ALBUMIN 4.5 04/27/2021 1514   AST 28 04/27/2021 1514   ALT 20 04/27/2021 1514   ALKPHOS 70 04/27/2021 1514   BILITOT 0.5 04/27/2021 1514   GFRNONAA >60 04/27/2021 1514   Lab Results  Component Value Date   CHOL 184 08/20/2020   HDL 64.80 08/20/2020   LDLCALC 106 (H) 08/20/2020   TRIG 66.0 08/20/2020   CHOLHDL 3 08/20/2020   No results found for: "HGBA1C" Lab Results  Component Value Date   VITAMINB12 494 10/02/2020   Lab Results  Component Value Date   TSH 4.14 10/02/2020     ASSESSMENT AND PLAN 57 y.o. year old female  has a past medical history of B12 deficiency, COVID-19, Leukopenia, Migraines, and Seborrheic keratosis. here with chronic migraines.  Continue qulipta She would like Botox approval, administerin the forehead by request of patient so as not to affet her forehead elevation Try Vyepti next after botox if not effective If she would like PT, she will mychart [place] and we will send to [place] for her left should been playing playing piickle bill    ICD-10-CM   1. Chronic migraine without aura without status migrainosus, not intractable  G43.709         Malaka reports that headaches seem to be the best managed on Qulipta and sumatriptan. She does continue to have 8-10 migrains days a month that are moderate to severe and > 15 total headache days a month. We have discussed options for prevention management. Healthy lifestyle habits reviewed. She verbalizes understanding and  agreement with this plan.   No orders of the defined types were placed in this encounter.    No orders of the defined types were placed in this encounter.    Shawnie Dapper, FNP-C 02/03/2023, 9:17 PM [facility] [address] [place], [place] [address] [phone]  I spent 20 minutes of face-to-face and non-face-to-face time with patient on the  1. Chronic migraine without aura without status migrainosus, not intractable    diagnosis.  This included previsit chart review, lab review, study review, order entry, electronic health record documentation, patient education on the different diagnostic and therapeutic options, counseling and coordination of care, risks and benefits of management, compliance, or risk factor reduction

## 2023-01-31 NOTE — Patient Instructions (Signed)
Continue qulipta Needs to be high in the forehead Botox approval Try Vyepti next If she would like PT, she will mychart [place] and we will send to [place] for her left should been playing playing piickle bill

## 2023-02-06 ENCOUNTER — Telehealth: Payer: Self-pay | Admitting: Neurology

## 2023-02-06 DIAGNOSIS — G43709 Chronic migraine without aura, not intractable, without status migrainosus: Secondary | ICD-10-CM

## 2023-02-06 NOTE — Telephone Encounter (Signed)
Anson Fret, MD  P [facility] 4 Calls; Yancey Flemings, [place] Please start migraine for botox approval. She can be placed with Amy for the procedure who has been seeing her as well in conjunction with me.  Chronic Migraine CPT 64615  Botox J0585 Units:200  G43.709 Chronic Migraine without aura, not intractable, without status migrainous

## 2023-02-06 NOTE — Telephone Encounter (Signed)
-----   Message from Anson Fret sent at 02/03/2023  9:15 PM EDT ----- Regarding: g43.709 Please start migraine for botox approval. She can be placed with Amy for the procedure who has been seeing her as well in conjunction with me.

## 2023-02-06 NOTE — Telephone Encounter (Signed)
Submitted benefit verification, will update once results are received. [id number]

## 2023-02-06 NOTE — Telephone Encounter (Signed)
Completed PA form and placed in nurse pod for MD signature.

## 2023-02-07 NOTE — Telephone Encounter (Signed)
Faxed signed [employer] form along with OV notes to [phone].

## 2023-02-08 MED ORDER — ONABOTULINUMTOXINA 200 UNITS IJ SOLR: INTRAMUSCULAR | 3 refills | Status: AC

## 2023-02-08 NOTE — Addendum Note (Signed)
Addended by: Bertram Savin on: 02/08/2023 03:02 PM   Modules accepted: Orders

## 2023-02-08 NOTE — Telephone Encounter (Signed)
Received approval from [facility]. PA#: [id number] (02/08/23-02/07/24).  Please send rx to [facility], thank you.

## 2023-02-08 NOTE — Telephone Encounter (Signed)
Botox 200 unit Rx sent to [facility] pharmacy.

## 2023-02-20 NOTE — Telephone Encounter (Signed)
[facility] left a voice  mail on my phone that they need the patient's insurance information. I called them and I was told that they need to get the information from the patient herself and asked me to have her call them.

## 2023-02-21 NOTE — Telephone Encounter (Signed)
I reached out to [facility] via provider portal. The rep I spoke with was able to take insurance info over the phone, I also faxed a copy of her card and PA letter.

## 2023-03-06 DIAGNOSIS — G43709 Chronic migraine without aura, not intractable, without status migrainosus: Secondary | ICD-10-CM | POA: Diagnosis not present

## 2023-03-09 ENCOUNTER — Other Ambulatory Visit: Payer: Self-pay | Admitting: *Deleted

## 2023-03-09 MED ORDER — SUMATRIPTAN SUCCINATE 100 MG PO TABS
50.0000 mg | ORAL_TABLET | Freq: Once | ORAL | 2 refills | Status: DC | PRN
Start: 2023-03-09 — End: 2023-06-05

## 2023-03-09 NOTE — Telephone Encounter (Signed)
Last seen on 01/31/23 Follow up scheduled on 03/21/23

## 2023-03-16 ENCOUNTER — Ambulatory Visit: Payer: BC Managed Care – PPO | Admitting: Family Medicine

## 2023-03-16 NOTE — Progress Notes (Signed)
03/21/23 ALL: Chaquetta presents for first Botox procedure. She was seen by Dr Lucia Gaskins 01/2023 and continued to have 8-10 headache days on Qulipta. Sumatriptan works well for abortive therapy.     Consent Form Botulism Toxin Injection For Chronic Migraine    Reviewed orally with patient, additionally signature is on file:  Botulism toxin has been approved by the Federal drug administration for treatment of chronic migraine. Botulism toxin does not cure chronic migraine and it may not be effective in some patients.  The administration of botulism toxin is accomplished by injecting a small amount of toxin into the muscles of the neck and head. Dosage must be titrated for each individual. Any benefits resulting from botulism toxin tend to wear off after 3 months with a repeat injection required if benefit is to be maintained. Injections are usually done every 3-4 months with maximum effect peak achieved by about 2 or 3 weeks. Botulism toxin is expensive and you should be sure of what costs you will incur resulting from the injection.  The side effects of botulism toxin use for chronic migraine may include:   -Transient, and usually mild, facial weakness with facial injections  -Transient, and usually mild, head or neck weakness with head/neck injections  -Reduction or loss of forehead facial animation due to forehead muscle weakness  -Eyelid drooping  -Dry eye  -Pain at the site of injection or bruising at the site of injection  -Double vision  -Potential unknown long term risks   Contraindications: You should not have Botox if you are pregnant, nursing, allergic to albumin, have an infection, skin condition, or muscle weakness at the site of the injection, or have myasthenia gravis, Lambert-Eaton syndrome, or ALS.  It is also possible that as with any injection, there may be an allergic reaction or no effect from the medication. Reduced effectiveness after repeated injections is sometimes  seen and rarely infection at the injection site may occur. All care will be taken to prevent these side effects. If therapy is given over a long time, atrophy and wasting in the muscle injected may occur. Occasionally the patient's become refractory to treatment because they develop antibodies to the toxin. In this event, therapy needs to be modified.  I have read the above information and consent to the administration of botulism toxin.    BOTOX PROCEDURE NOTE FOR MIGRAINE HEADACHE  Contraindications and precautions discussed with patient(above). Aseptic procedure was observed and patient tolerated procedure. Procedure performed by Shawnie Dapper, FNP-C.   The condition has existed for more than 6 months, and pt does not have a diagnosis of ALS, Myasthenia Gravis or Lambert-Eaton Syndrome.  Risks and benefits of injections discussed and pt agrees to proceed with the procedure.  Written consent obtained  These injections are medically necessary. Pt  receives good benefits from these injections. These injections do not cause sedations or hallucinations which the oral therapies may cause.   Description of procedure:  The patient was placed in a sitting position. The standard protocol was used for Botox as follows, with 5 units of Botox injected at each site:  -Procerus muscle, midline injection  -Corrugator muscle, bilateral injection  -Frontalis muscle, bilateral injection, with 2 sites each side, medial injection was performed in the upper one third of the frontalis muscle, in the region vertical from the medial inferior edge of the superior orbital rim. The lateral injection was again in the upper one third of the forehead vertically above the lateral limbus of the  cornea, 1.5 cm lateral to the medial injection site.  -Temporalis muscle injection, 4 sites, bilaterally. The first injection was 3 cm above the tragus of the ear, second injection site was 1.5 cm to 3 cm up from the first injection  site in line with the tragus of the ear. The third injection site was 1.5-3 cm forward between the first 2 injection sites. The fourth injection site was 1.5 cm posterior to the second injection site. 5th site laterally in the temporalis  muscleat the level of the outer canthus.  -Occipitalis muscle injection, 3 sites, bilaterally. The first injection was done one half way between the occipital protuberance and the tip of the mastoid process behind the ear. The second injection site was done lateral and superior to the first, 1 fingerbreadth from the first injection. The third injection site was 1 fingerbreadth superiorly and medially from the first injection site.  -Cervical paraspinal muscle injection, 2 sites, bilaterally. The first injection site was 1 cm from the midline of the cervical spine, 3 cm inferior to the lower border of the occipital protuberance. The second injection site was 1.5 cm superiorly and laterally to the first injection site.  -Trapezius muscle injection was performed at 3 sites, bilaterally. The first injection site was in the upper trapezius muscle halfway between the inflection point of the neck, and the acromion. The second injection site was one half way between the acromion and the first injection site. The third injection was done between the first injection site and the inflection point of the neck.   Will return for repeat injection in 3 months.   A total of 200 units of Botox was prepared, 155 units of Botox was injected as documented above, any Botox not injected was wasted. The patient tolerated the procedure well, there were no complications of the above procedure.

## 2023-03-21 ENCOUNTER — Ambulatory Visit: Payer: BC Managed Care – PPO | Admitting: Family Medicine

## 2023-03-21 DIAGNOSIS — G43709 Chronic migraine without aura, not intractable, without status migrainosus: Secondary | ICD-10-CM

## 2023-03-21 MED ORDER — ONABOTULINUMTOXINA 200 UNITS IJ SOLR
155.0000 [IU] | Freq: Once | INTRAMUSCULAR | Status: AC
  Administered 2023-03-21: 155 [IU] via INTRAMUSCULAR

## 2023-03-21 NOTE — Progress Notes (Signed)
Botox- 200 units x 1 vial Lot: [id number] Expiration: 07/2025 NDC: 4540-9811-91  Bacteriostatic 0.9% Sodium Chloride- 4 mL  Lot: [id number] Expiration: 08/22/2023 NDC: 6213-0865-78  Dx: I69.629 S/P  Witnessed by Marlis Edelson

## 2023-03-31 ENCOUNTER — Other Ambulatory Visit: Payer: Self-pay | Admitting: Family Medicine

## 2023-04-03 MED ORDER — QULIPTA 60 MG PO TABS: 1.0000 | ORAL_TABLET | Freq: Every day | ORAL | 0 refills | Status: DC

## 2023-06-05 ENCOUNTER — Encounter: Payer: Self-pay | Admitting: Family Medicine

## 2023-06-05 ENCOUNTER — Other Ambulatory Visit: Payer: Self-pay | Admitting: *Deleted

## 2023-06-05 MED ORDER — SUMATRIPTAN SUCCINATE 100 MG PO TABS: 50.0000 mg | ORAL_TABLET | Freq: Once | ORAL | 2 refills | Status: DC | PRN

## 2023-06-14 NOTE — Progress Notes (Deleted)
06/14/23 ALL: Veronica Henderson returns for Botox. CORR???  03/21/2023 ALL: Veronica Henderson presents for first Botox procedure. She was seen by Dr Lucia Gaskins 01/2023 and continued to have 8-10 headache days on Qulipta. Sumatriptan works well for abortive therapy.     Consent Form Botulism Toxin Injection For Chronic Migraine    Reviewed orally with patient, additionally signature is on file:  Botulism toxin has been approved by the Federal drug administration for treatment of chronic migraine. Botulism toxin does not cure chronic migraine and it may not be effective in some patients.  The administration of botulism toxin is accomplished by injecting a small amount of toxin into the muscles of the neck and head. Dosage must be titrated for each individual. Any benefits resulting from botulism toxin tend to wear off after 3 months with a repeat injection required if benefit is to be maintained. Injections are usually done every 3-4 months with maximum effect peak achieved by about 2 or 3 weeks. Botulism toxin is expensive and you should be sure of what costs you will incur resulting from the injection.  The side effects of botulism toxin use for chronic migraine may include:   -Transient, and usually mild, facial weakness with facial injections  -Transient, and usually mild, head or neck weakness with head/neck injections  -Reduction or loss of forehead facial animation due to forehead muscle weakness  -Eyelid drooping  -Dry eye  -Pain at the site of injection or bruising at the site of injection  -Double vision  -Potential unknown long term risks   Contraindications: You should not have Botox if you are pregnant, nursing, allergic to albumin, have an infection, skin condition, or muscle weakness at the site of the injection, or have myasthenia gravis, Lambert-Eaton syndrome, or ALS.  It is also possible that as with any injection, there may be an allergic reaction or no effect from the medication. Reduced  effectiveness after repeated injections is sometimes seen and rarely infection at the injection site may occur. All care will be taken to prevent these side effects. If therapy is given over a long time, atrophy and wasting in the muscle injected may occur. Occasionally the patient's become refractory to treatment because they develop antibodies to the toxin. In this event, therapy needs to be modified.  I have read the above information and consent to the administration of botulism toxin.    BOTOX PROCEDURE NOTE FOR MIGRAINE HEADACHE  Contraindications and precautions discussed with patient(above). Aseptic procedure was observed and patient tolerated procedure. Procedure performed by Shawnie Dapper, FNP-C.   The condition has existed for more than 6 months, and pt does not have a diagnosis of ALS, Myasthenia Gravis or Lambert-Eaton Syndrome.  Risks and benefits of injections discussed and pt agrees to proceed with the procedure.  Written consent obtained  These injections are medically necessary. Pt  receives good benefits from these injections. These injections do not cause sedations or hallucinations which the oral therapies may cause.   Description of procedure:  The patient was placed in a sitting position. The standard protocol was used for Botox as follows, with 5 units of Botox injected at each site:  -Procerus muscle, midline injection  -Corrugator muscle, bilateral injection  -Frontalis muscle, bilateral injection, with 2 sites each side, medial injection was performed in the upper one third of the frontalis muscle, in the region vertical from the medial inferior edge of the superior orbital rim. The lateral injection was again in the upper one third of the  forehead vertically above the lateral limbus of the cornea, 1.5 cm lateral to the medial injection site.  -Temporalis muscle injection, 4 sites, bilaterally. The first injection was 3 cm above the tragus of the ear, second injection  site was 1.5 cm to 3 cm up from the first injection site in line with the tragus of the ear. The third injection site was 1.5-3 cm forward between the first 2 injection sites. The fourth injection site was 1.5 cm posterior to the second injection site. 5th site laterally in the temporalis  muscleat the level of the outer canthus.  -Occipitalis muscle injection, 3 sites, bilaterally. The first injection was done one half way between the occipital protuberance and the tip of the mastoid process behind the ear. The second injection site was done lateral and superior to the first, 1 fingerbreadth from the first injection. The third injection site was 1 fingerbreadth superiorly and medially from the first injection site.  -Cervical paraspinal muscle injection, 2 sites, bilaterally. The first injection site was 1 cm from the midline of the cervical spine, 3 cm inferior to the lower border of the occipital protuberance. The second injection site was 1.5 cm superiorly and laterally to the first injection site.  -Trapezius muscle injection was performed at 3 sites, bilaterally. The first injection site was in the upper trapezius muscle halfway between the inflection point of the neck, and the acromion. The second injection site was one half way between the acromion and the first injection site. The third injection was done between the first injection site and the inflection point of the neck.   Will return for repeat injection in 3 months.   A total of 200 units of Botox was prepared, 155 units of Botox was injected as documented above, any Botox not injected was wasted. The patient tolerated the procedure well, there were no complications of the above procedure.

## 2023-06-19 ENCOUNTER — Telehealth: Payer: Self-pay | Admitting: Neurology

## 2023-06-19 ENCOUNTER — Ambulatory Visit: Payer: BC Managed Care – PPO | Admitting: Family Medicine

## 2023-06-19 DIAGNOSIS — G43709 Chronic migraine without aura, not intractable, without status migrainosus: Secondary | ICD-10-CM

## 2023-06-19 NOTE — Telephone Encounter (Signed)
Veronica Henderson @ [facility] states the authorization for Botox was done under Pt's old member ID.  Veronica Henderson is asking it be transferred to pt's new member ID, please call [phone]

## 2023-06-21 NOTE — Telephone Encounter (Signed)
I submitted auth under new ID on 1/23, awaiting determination from insurance.

## 2023-06-27 NOTE — Telephone Encounter (Signed)
I called [employer] to check status, they state auth was not received. I verified fax # and resent to [phone].

## 2023-07-03 NOTE — Telephone Encounter (Signed)
 I called [facility] precert dept at [phone], spoke with [place] She states previous auth carried over to pt's new member ID. I informed [facility] and they have escalated to their clearance team to resolve.

## 2023-07-05 ENCOUNTER — Other Ambulatory Visit: Payer: Self-pay

## 2023-07-05 MED ORDER — QULIPTA 60 MG PO TABS: 1.0000 | ORAL_TABLET | Freq: Every day | ORAL | 1 refills | Status: DC

## 2023-07-06 NOTE — Telephone Encounter (Signed)
Contacted [facility] again, spoke with [place]. She was able to verify that medical benefits have been verified. I provided her with copay card and she states that pt will still have $550 to pay out of pocket. They will contact her over the next few days to discuss.

## 2023-08-08 NOTE — Progress Notes (Unsigned)
 08/08/23 ALL: [name] returns for Botox. First procedure 02/2023. There was some difficulty getting Botox approved with new insurance plan. Sumatriptan?  03/21/2023 ALL: Veronica Henderson presents for first Botox procedure. She was seen by Dr Lucia Gaskins 01/2023 and continued to have 8-10 headache days on Qulipta. Sumatriptan works well for abortive therapy.     Consent Form Botulism Toxin Injection For Chronic Migraine    Reviewed orally with patient, additionally signature is on file:  Botulism toxin has been approved by the Federal drug administration for treatment of chronic migraine. Botulism toxin does not cure chronic migraine and it may not be effective in some patients.  The administration of botulism toxin is accomplished by injecting a small amount of toxin into the muscles of the neck and head. Dosage must be titrated for each individual. Any benefits resulting from botulism toxin tend to wear off after 3 months with a repeat injection required if benefit is to be maintained. Injections are usually done every 3-4 months with maximum effect peak achieved by about 2 or 3 weeks. Botulism toxin is expensive and you should be sure of what costs you will incur resulting from the injection.  The side effects of botulism toxin use for chronic migraine may include:   -Transient, and usually mild, facial weakness with facial injections  -Transient, and usually mild, head or neck weakness with head/neck injections  -Reduction or loss of forehead facial animation due to forehead muscle weakness  -Eyelid drooping  -Dry eye  -Pain at the site of injection or bruising at the site of injection  -Double vision  -Potential unknown long term risks   Contraindications: You should not have Botox if you are pregnant, nursing, allergic to albumin, have an infection, skin condition, or muscle weakness at the site of the injection, or have myasthenia gravis, Lambert-Eaton syndrome, or ALS.  It is also possible  that as with any injection, there may be an allergic reaction or no effect from the medication. Reduced effectiveness after repeated injections is sometimes seen and rarely infection at the injection site may occur. All care will be taken to prevent these side effects. If therapy is given over a long time, atrophy and wasting in the muscle injected may occur. Occasionally the patient's become refractory to treatment because they develop antibodies to the toxin. In this event, therapy needs to be modified.  I have read the above information and consent to the administration of botulism toxin.    BOTOX PROCEDURE NOTE FOR MIGRAINE HEADACHE  Contraindications and precautions discussed with patient(above). Aseptic procedure was observed and patient tolerated procedure. Procedure performed by Shawnie Dapper, FNP-C.   The condition has existed for more than 6 months, and pt does not have a diagnosis of ALS, Myasthenia Gravis or Lambert-Eaton Syndrome.  Risks and benefits of injections discussed and pt agrees to proceed with the procedure.  Written consent obtained  These injections are medically necessary. Pt  receives good benefits from these injections. These injections do not cause sedations or hallucinations which the oral therapies may cause.   Description of procedure:  The patient was placed in a sitting position. The standard protocol was used for Botox as follows, with 5 units of Botox injected at each site:  -Procerus muscle, midline injection  -Corrugator muscle, bilateral injection  -Frontalis muscle, bilateral injection, with 2 sites each side, medial injection was performed in the upper one third of the frontalis muscle, in the region vertical from the medial inferior edge of the superior  orbital rim. The lateral injection was again in the upper one third of the forehead vertically above the lateral limbus of the cornea, 1.5 cm lateral to the medial injection site.  -Temporalis muscle  injection, 4 sites, bilaterally. The first injection was 3 cm above the tragus of the ear, second injection site was 1.5 cm to 3 cm up from the first injection site in line with the tragus of the ear. The third injection site was 1.5-3 cm forward between the first 2 injection sites. The fourth injection site was 1.5 cm posterior to the second injection site. 5th site laterally in the temporalis  muscleat the level of the outer canthus.  -Occipitalis muscle injection, 3 sites, bilaterally. The first injection was done one half way between the occipital protuberance and the tip of the mastoid process behind the ear. The second injection site was done lateral and superior to the first, 1 fingerbreadth from the first injection. The third injection site was 1 fingerbreadth superiorly and medially from the first injection site.  -Cervical paraspinal muscle injection, 2 sites, bilaterally. The first injection site was 1 cm from the midline of the cervical spine, 3 cm inferior to the lower border of the occipital protuberance. The second injection site was 1.5 cm superiorly and laterally to the first injection site.  -Trapezius muscle injection was performed at 3 sites, bilaterally. The first injection site was in the upper trapezius muscle halfway between the inflection point of the neck, and the acromion. The second injection site was one half way between the acromion and the first injection site. The third injection was done between the first injection site and the inflection point of the neck.   Will return for repeat injection in 3 months.   A total of 200 units of Botox was prepared, 155 units of Botox was injected as documented above, any Botox not injected was wasted. The patient tolerated the procedure well, there were no complications of the above procedure.

## 2023-08-10 ENCOUNTER — Ambulatory Visit: Admitting: Family Medicine

## 2023-08-10 VITALS — BP 108/72

## 2023-08-10 DIAGNOSIS — G43709 Chronic migraine without aura, not intractable, without status migrainosus: Secondary | ICD-10-CM

## 2023-08-10 MED ORDER — ONABOTULINUMTOXINA 200 UNITS IJ SOLR
155.0000 [IU] | Freq: Once | INTRAMUSCULAR | Status: AC
  Administered 2023-08-10: 155 [IU] via INTRAMUSCULAR

## 2023-08-10 NOTE — Progress Notes (Signed)
 Botox- 200 units x 1 vial Lot: [id number] Expiration: 2027/06 NDC: 0023-3921-02  Bacteriostatic 0.9% Sodium Chloride- 4 mL  Lot: [id number] Expiration: 03/23/24 NDC: 8119147829  Dx: F62.130  S/P  Witnessed by [facility] J RN

## 2023-08-29 ENCOUNTER — Other Ambulatory Visit: Payer: Self-pay | Admitting: *Deleted

## 2023-08-29 MED ORDER — SUMATRIPTAN SUCCINATE 100 MG PO TABS: 50.0000 mg | ORAL_TABLET | Freq: Once | ORAL | 2 refills | Status: DC | PRN

## 2023-10-18 ENCOUNTER — Other Ambulatory Visit (HOSPITAL_COMMUNITY): Payer: Self-pay

## 2023-10-26 ENCOUNTER — Other Ambulatory Visit (HOSPITAL_COMMUNITY): Payer: Self-pay

## 2023-10-26 ENCOUNTER — Telehealth: Payer: Self-pay | Admitting: Neurology

## 2023-10-26 NOTE — Telephone Encounter (Signed)
 Pt reports that a PA is needed for Atogepant  (QULIPTA ) 60 MG TABS

## 2023-10-27 ENCOUNTER — Other Ambulatory Visit (HOSPITAL_COMMUNITY): Payer: Self-pay

## 2023-10-27 ENCOUNTER — Telehealth: Payer: Self-pay

## 2023-10-27 ENCOUNTER — Encounter: Payer: Self-pay | Admitting: Neurology

## 2023-10-27 ENCOUNTER — Other Ambulatory Visit: Payer: Self-pay | Admitting: Family Medicine

## 2023-10-27 NOTE — Telephone Encounter (Signed)
 Pharmacy Patient Advocate Encounter   Received notification from Physician's Office that prior authorization for Qulipta  is required/requested.   Insurance verification completed.   The patient is insured through [facility] .   Per test claim: PA required; PA submitted to above mentioned insurance via Fax Key/confirmation #/EOC N/A Status is pending  Faxed form and clinicals to [facility] at [phone]

## 2023-10-30 NOTE — Telephone Encounter (Signed)
 Noted thanks, I updated the pt.

## 2023-10-30 NOTE — Telephone Encounter (Signed)
 Pharmacy Patient Advocate Encounter  Received notification from [facility] that Prior Authorization for Qulipta  has been DENIED.  Full denial letter will be uploaded to the media tab. See denial reason below.   PA #/Case ID/Reference #: [id number]

## 2023-10-31 ENCOUNTER — Telehealth: Payer: Self-pay | Admitting: *Deleted

## 2023-10-31 NOTE — Telephone Encounter (Signed)
 I will forward to pharmacist for an appeals review

## 2023-10-31 NOTE — Telephone Encounter (Signed)
   Maybe PA can be done? Look she has history of taking Rx in past.

## 2023-10-31 NOTE — Telephone Encounter (Signed)
 I called patient.  I advised her that Qulipta  was denied because she is also using Botox  for migraine prevention.  She would like to appeal this decision.  She reports that she has been off Qulipta  for few weeks and her headaches have increased in frequency.  She reports that she was unsure if Qulipta  was working but is convinced now that it does work to prevent migraines.  I will send this to our pharmacy team for an appeal.  I advised patient that we will keep her updated.  Patient is in agreement of this plan.

## 2023-10-31 NOTE — Telephone Encounter (Signed)
 Message sent to PA team.

## 2023-11-01 ENCOUNTER — Telehealth: Payer: Self-pay | Admitting: Family Medicine

## 2023-11-01 ENCOUNTER — Telehealth: Payer: Self-pay | Admitting: Pharmacist

## 2023-11-01 NOTE — Telephone Encounter (Signed)
 [facility]  Has called back asking to speak with Botox  coordinator

## 2023-11-01 NOTE — Telephone Encounter (Signed)
 Appeal has been submitted for Qulipta . Will advise when response is received or follow up in 1 week. Please be advised that most companies may take 30 days to make a decision. Appeal letter and supporting information have been faxed to [phone] on 11/01/2023.  Thank you, Dene Fines, PharmD Clinical Pharmacist  [place]  Direct Dial: [phone]

## 2023-11-01 NOTE — Telephone Encounter (Signed)
 [facility] called for confirmation of auth for Botox  for pt

## 2023-11-03 NOTE — Telephone Encounter (Signed)
 Dominque from [facility] called to follow up on Pt  PA for Botox    Callback number is  [phone]

## 2023-11-06 ENCOUNTER — Other Ambulatory Visit (HOSPITAL_COMMUNITY): Payer: Self-pay

## 2023-11-06 NOTE — Telephone Encounter (Signed)
 I have relayed to [facility] with [facility] multiple times that [employer] carried over to new plan. They clearly have this information, because they filled her Botox  and sent it to us  on 6/11. Since Bearl Botts seems to be calling our office everyday, I completed and faxed new [employer] PA form.

## 2023-11-07 NOTE — Progress Notes (Signed)
 11/08/23 ALL: Veronica Henderson returns for Botox . Last procedure 07/2023. She has not noted any significant benefit of Botox . She was unable to get Qulipta  covered with new insurance and has not taken since 09/2023. She has noted significant worsening in headaches since. She is taking a half tablet of sumatriptan  with each headache and going through a full prescription. We discussed options. She will try one more round of Botox . 24 sample tablets of Qulipta  given in office. We will appeal if needed. Will adjust plan if no improvement in 6 weeks.    08/10/2023 ALL: Veronica Henderson returns for Botox . First procedure 02/2023. There was some difficulty getting Botox  approved with new insurance plan. She is not sure she noted any significant improvement in migraine frequency. She continues to have 7-9 migraine days per month. Sumatriptan  helps.   03/21/2023 ALL: Veronica Henderson presents for first Botox  procedure. She was seen by Dr Tresia Fruit 01/2023 and continued to have 8-10 headache days on Qulipta . Sumatriptan  works well for abortive therapy.     Consent Form Botulism Toxin Injection For Chronic Migraine    Reviewed orally with patient, additionally signature is on file:  Botulism toxin has been approved by the Federal drug administration for treatment of chronic migraine. Botulism toxin does not cure chronic migraine and it may not be effective in some patients.  The administration of botulism toxin is accomplished by injecting a small amount of toxin into the muscles of the neck and head. Dosage must be titrated for each individual. Any benefits resulting from botulism toxin tend to wear off after 3 months with a repeat injection required if benefit is to be maintained. Injections are usually done every 3-4 months with maximum effect peak achieved by about 2 or 3 weeks. Botulism toxin is expensive and you should be sure of what costs you will incur resulting from the injection.  The side effects of botulism toxin use for chronic  migraine may include:   -Transient, and usually mild, facial weakness with facial injections  -Transient, and usually mild, head or neck weakness with head/neck injections  -Reduction or loss of forehead facial animation due to forehead muscle weakness  -Eyelid drooping  -Dry eye  -Pain at the site of injection or bruising at the site of injection  -Double vision  -Potential unknown long term risks   Contraindications: You should not have Botox  if you are pregnant, nursing, allergic to albumin, have an infection, skin condition, or muscle weakness at the site of the injection, or have myasthenia gravis, Lambert-Eaton syndrome, or ALS.  It is also possible that as with any injection, there may be an allergic reaction or no effect from the medication. Reduced effectiveness after repeated injections is sometimes seen and rarely infection at the injection site may occur. All care will be taken to prevent these side effects. If therapy is given over a long time, atrophy and wasting in the muscle injected may occur. Occasionally the patient's become refractory to treatment because they develop antibodies to the toxin. In this event, therapy needs to be modified.  I have read the above information and consent to the administration of botulism toxin.    BOTOX  PROCEDURE NOTE FOR MIGRAINE HEADACHE  Contraindications and precautions discussed with patient(above). Aseptic procedure was observed and patient tolerated procedure. Procedure performed by Terrilyn Fick, FNP-C.   The condition has existed for more than 6 months, and pt does not have a diagnosis of ALS, Myasthenia Gravis or Lambert-Eaton Syndrome.  Risks and benefits of injections  discussed and pt agrees to proceed with the procedure.  Written consent obtained  These injections are medically necessary. Pt  receives good benefits from these injections. These injections do not cause sedations or hallucinations which the oral therapies may  cause.   Description of procedure:  The patient was placed in a sitting position. The standard protocol was used for Botox  as follows, with 5 units of Botox  injected at each site:  -Procerus muscle, midline injection  -Corrugator muscle, bilateral injection  -Frontalis muscle, bilateral injection, with 2 sites each side, medial injection was performed in the upper one third of the frontalis muscle, in the region vertical from the medial inferior edge of the superior orbital rim. The lateral injection was again in the upper one third of the forehead vertically above the lateral limbus of the cornea, 1.5 cm lateral to the medial injection site.  -Temporalis muscle injection, 4 sites, bilaterally. The first injection was 3 cm above the tragus of the ear, second injection site was 1.5 cm to 3 cm up from the first injection site in line with the tragus of the ear. The third injection site was 1.5-3 cm forward between the first 2 injection sites. The fourth injection site was 1.5 cm posterior to the second injection site. 5th site laterally in the temporalis  muscleat the level of the outer canthus.  -Occipitalis muscle injection, 3 sites, bilaterally. The first injection was done one half way between the occipital protuberance and the tip of the mastoid process behind the ear. The second injection site was done lateral and superior to the first, 1 fingerbreadth from the first injection. The third injection site was 1 fingerbreadth superiorly and medially from the first injection site.  -Cervical paraspinal muscle injection, 2 sites, bilaterally. The first injection site was 1 cm from the midline of the cervical spine, 3 cm inferior to the lower border of the occipital protuberance. The second injection site was 1.5 cm superiorly and laterally to the first injection site.  -Trapezius muscle injection was performed at 3 sites, bilaterally. The first injection site was in the upper trapezius muscle  halfway between the inflection point of the neck, and the acromion. The second injection site was one half way between the acromion and the first injection site. The third injection was done between the first injection site and the inflection point of the neck.   Will return for repeat injection in 3 months.   A total of 200 units of Botox  was prepared, 155 units of Botox  was injected as documented above, any Botox  not injected was wasted. The patient tolerated the procedure well, there were no complications of the above procedure.

## 2023-11-07 NOTE — Telephone Encounter (Signed)
 Auth#: [id number] (11/06/23-04/22/24); relayed new auth to [facility].

## 2023-11-08 ENCOUNTER — Encounter: Payer: Self-pay | Admitting: Family Medicine

## 2023-11-08 ENCOUNTER — Ambulatory Visit: Admitting: Family Medicine

## 2023-11-08 VITALS — BP 123/75 | HR 70

## 2023-11-08 DIAGNOSIS — G43709 Chronic migraine without aura, not intractable, without status migrainosus: Secondary | ICD-10-CM | POA: Diagnosis not present

## 2023-11-08 MED ORDER — ONABOTULINUMTOXINA 200 UNITS IJ SOLR
155.0000 [IU] | Freq: Once | INTRAMUSCULAR | Status: AC
  Administered 2023-11-08: 155 [IU] via INTRAMUSCULAR

## 2023-11-08 NOTE — Progress Notes (Signed)
 Botox -200U x 1vial Lot: [id number] Expiration: 02/2026 NDC: 8119-1478-29  Bacteriostatic 0.9% Sodium Chloride - 4mL total Lot: [id number] Expiration:11/19/2024 NDC: 8657-8469-62  Specialty pharmacy  Witnessed by: Thamas Fillers, CMA

## 2023-11-13 ENCOUNTER — Other Ambulatory Visit (HOSPITAL_COMMUNITY): Payer: Self-pay

## 2023-11-15 ENCOUNTER — Other Ambulatory Visit (HOSPITAL_COMMUNITY): Payer: Self-pay

## 2023-11-23 ENCOUNTER — Other Ambulatory Visit: Payer: Self-pay

## 2023-11-23 MED ORDER — SUMATRIPTAN SUCCINATE 100 MG PO TABS: 50.0000 mg | ORAL_TABLET | Freq: Once | ORAL | 2 refills | Status: DC | PRN

## 2023-11-27 ENCOUNTER — Encounter: Payer: Self-pay | Admitting: Family Medicine

## 2023-11-28 ENCOUNTER — Other Ambulatory Visit (HOSPITAL_COMMUNITY): Payer: Self-pay

## 2023-11-28 ENCOUNTER — Other Ambulatory Visit: Payer: Self-pay | Admitting: *Deleted

## 2023-11-28 DIAGNOSIS — G43709 Chronic migraine without aura, not intractable, without status migrainosus: Secondary | ICD-10-CM

## 2023-11-28 MED ORDER — QULIPTA 60 MG PO TABS: 1.0000 | ORAL_TABLET | Freq: Every day | ORAL | Status: DC

## 2023-11-28 MED ORDER — QULIPTA 60 MG PO TABS: 1.0000 | ORAL_TABLET | Freq: Every day | ORAL | 0 refills | Status: DC

## 2023-11-28 NOTE — Telephone Encounter (Signed)
 Per [employer] the appeal was denied for Qulipta .  They stated a letter was mailed to the patient and faxed to the office on 11/27/2023.  I have asked that they refax the letter.  Once I receive it, I will upload under the patient's media tab.  Thank you, Devere Pandy, PharmD Clinical Pharmacist  [place]  Direct Dial: [phone]

## 2023-11-28 NOTE — Telephone Encounter (Signed)
 Hello, any updates on this appeal? Thank you

## 2023-11-29 ENCOUNTER — Other Ambulatory Visit (HOSPITAL_COMMUNITY): Payer: Self-pay

## 2023-11-29 NOTE — Telephone Encounter (Signed)
 Veronica Henderson- I reviewed denial letter. Denial letter that was uploaded under media stating she cannot be on Qulipta  and Botox  together. What would you like to do?

## 2023-12-01 ENCOUNTER — Telehealth: Payer: Self-pay | Admitting: Pharmacist

## 2023-12-01 NOTE — Telephone Encounter (Signed)
 Patient is stopping Botox -a PA request was faxed [phone]) to [facility] with this new information.  Pharmacy Patient Advocate Encounter   Received notification from Physician's Office that prior authorization for Qulipta  is required/requested.   Insurance verification completed.   The patient is insured through [facility] .

## 2023-12-06 ENCOUNTER — Telehealth: Payer: Self-pay | Admitting: Pharmacist

## 2023-12-06 NOTE — Telephone Encounter (Signed)
 PA Cancelled due to a denial already on file.  Appeal will be submitted and documented in a separate telephone encounter.

## 2023-12-06 NOTE — Telephone Encounter (Signed)
 A 2nd level appeal has been submitted for Qulipta . Will advise when response is received, please be advised that most companies may take 30 days to make a decision. Appeal letter and new information regarding the discontinuation of Botox  has been faxed to the insurance at [phone] on 12/06/2023 @9 :36 am.  Thank you, Devere Pandy, PharmD Clinical Pharmacist  [place]  Direct Dial: [phone]

## 2023-12-08 ENCOUNTER — Encounter: Payer: Self-pay | Admitting: Family Medicine

## 2023-12-12 ENCOUNTER — Other Ambulatory Visit: Payer: Self-pay | Admitting: *Deleted

## 2023-12-12 ENCOUNTER — Encounter: Payer: Self-pay | Admitting: Family Medicine

## 2023-12-12 DIAGNOSIS — G43709 Chronic migraine without aura, not intractable, without status migrainosus: Secondary | ICD-10-CM

## 2023-12-12 MED ORDER — QULIPTA 60 MG PO TABS: 1.0000 | ORAL_TABLET | Freq: Every day | ORAL | Status: AC

## 2023-12-12 NOTE — Telephone Encounter (Signed)
 Okay to give Qulipta  Samples?

## 2023-12-13 ENCOUNTER — Ambulatory Visit

## 2023-12-27 ENCOUNTER — Other Ambulatory Visit (HOSPITAL_COMMUNITY): Payer: Self-pay

## 2023-12-29 ENCOUNTER — Other Ambulatory Visit (HOSPITAL_COMMUNITY): Payer: Self-pay

## 2024-01-01 ENCOUNTER — Other Ambulatory Visit (HOSPITAL_COMMUNITY): Payer: Self-pay

## 2024-01-09 ENCOUNTER — Encounter: Payer: Self-pay | Admitting: Family Medicine

## 2024-01-09 ENCOUNTER — Other Ambulatory Visit: Payer: Self-pay | Admitting: *Deleted

## 2024-01-09 MED ORDER — QULIPTA 60 MG PO TABS: 1.0000 | ORAL_TABLET | Freq: Every day | ORAL | 0 refills | Status: AC

## 2024-01-15 ENCOUNTER — Other Ambulatory Visit (HOSPITAL_COMMUNITY): Payer: Self-pay

## 2024-01-19 ENCOUNTER — Telehealth: Payer: Self-pay | Admitting: Neurology

## 2024-01-19 NOTE — Telephone Encounter (Signed)
 Pt has called and scheduled her 1 yr f/u with wait list

## 2024-01-26 ENCOUNTER — Other Ambulatory Visit (HOSPITAL_COMMUNITY): Payer: Self-pay

## 2024-01-29 ENCOUNTER — Other Ambulatory Visit (HOSPITAL_COMMUNITY): Payer: Self-pay

## 2024-01-29 NOTE — Telephone Encounter (Signed)
 Qulipta  has been approved for daily therapy by the insurance through 04/19/2024

## 2024-02-07 ENCOUNTER — Ambulatory Visit: Admitting: Family Medicine

## 2024-02-13 ENCOUNTER — Other Ambulatory Visit: Payer: Self-pay | Admitting: *Deleted

## 2024-02-13 MED ORDER — SUMATRIPTAN SUCCINATE 100 MG PO TABS: 50.0000 mg | ORAL_TABLET | Freq: Once | ORAL | 0 refills | Status: DC | PRN

## 2024-02-13 NOTE — Telephone Encounter (Signed)
 Last office visit was 01/31/23, Last botox  visit was 11/08/23 Follow up scheduled on 03/19/24

## 2024-03-15 ENCOUNTER — Other Ambulatory Visit: Payer: Self-pay

## 2024-03-15 ENCOUNTER — Encounter: Payer: Self-pay | Admitting: Family Medicine

## 2024-03-15 MED ORDER — SUMATRIPTAN SUCCINATE 100 MG PO TABS: 50.0000 mg | ORAL_TABLET | Freq: Once | ORAL | 0 refills | Status: DC | PRN

## 2024-03-19 ENCOUNTER — Ambulatory Visit: Admitting: Neurology

## 2024-03-27 ENCOUNTER — Ambulatory Visit: Admitting: Neurology

## 2024-03-27 ENCOUNTER — Encounter: Payer: Self-pay | Admitting: Neurology

## 2024-03-27 VITALS — BP 114/79 | HR 70 | Ht 65.0 in | Wt 136.0 lb

## 2024-03-27 DIAGNOSIS — G43709 Chronic migraine without aura, not intractable, without status migrainosus: Secondary | ICD-10-CM

## 2024-03-27 MED ORDER — SUMATRIPTAN SUCCINATE 100 MG PO TABS: 50.0000 mg | ORAL_TABLET | Freq: Once | ORAL | 6 refills | Status: AC | PRN

## 2024-03-27 NOTE — Patient Instructions (Signed)
 OnabotulinumtoxinA Injection (Medical Use) What is this medication? ONABOTULINUMTOXINA (o na BOTT you lye num tox in eh) treats severe muscle spasms. It may also be used to prevent migraine headaches. It can treat excessive sweating when other medications do not work well enough. This medicine may be used for other purposes; ask your health care provider or pharmacist if you have questions. COMMON BRAND NAME(S): Botox What should I tell my care team before I take this medication? They need to know if you have any of these conditions: Breathing problems Cerebral palsy spasms Difficulty urinating Heart problems History of surgery where this medication is going to be used Infection at the site where this medication is going to be used Myasthenia gravis or other neurologic disease Nerve or muscle disease Surgery plans Take medications that treat or prevent blood clots Thyroid problems An unusual or allergic reaction to botulinum toxin, albumin, other medications, foods, dyes, or preservatives Pregnant or trying to get pregnant Breast-feeding How should I use this medication? This medication is for injected into a muscle. It is given by your care team in a hospital or clinic setting. A special MedGuide will be given to you before each treatment. Be sure to read this information carefully each time. Talk to your care team about the use of this medication in children. While this medication may be prescribed for children as young as 2 years for selected conditions, precautions do apply. Overdosage: If you think you have taken too much of this medicine contact a poison control center or emergency room at once. NOTE: This medicine is only for you. Do not share this medicine with others. What if I miss a dose? This does not apply. What may interact with this medication? Aminoglycoside antibiotics, such as gentamicin, neomycin, tobramycin Muscle relaxants Other botulinum toxin injections This  list may not describe all possible interactions. Give your health care provider a list of all the medicines, herbs, non-prescription drugs, or dietary supplements you use. Also tell them if you smoke, drink alcohol, or use illegal drugs. Some items may interact with your medicine. What should I watch for while using this medication? Visit your care team for regular check ups. This medication will cause weakness in the muscle where it is injected. Tell your care team if you feel unusually weak in other muscles. Get medical help right away if you have problems with breathing, swallowing, or talking. This medication might make your eyelids droop or make you see blurry or double. If you have weak muscles or trouble seeing do not drive a car, use machinery, or do other dangerous activities. This medication contains albumin from human blood. It may be possible to pass an infection in this medication, but no cases have been reported. Talk to your care team about the risks and benefits of this medication. If your activities have been limited by your condition, go back to your regular routine slowly after treatment with this medication. What side effects may I notice from receiving this medication? Side effects that you should report to your care team as soon as possible: Allergic reactions--skin rash, itching, hives, swelling of the face, lips, tongue, or throat Dryness or irritation of the eyes, eye pain, change in vision, sensitivity to light Infection--fever, chills, cough, sore throat, wounds that don't heal, pain or trouble when passing urine, general feeling of discomfort or being unwell Spread of botulinum toxin effects--unusual weakness or fatigue, blurry or double vision, trouble swallowing, hoarseness or trouble speaking, trouble breathing, loss of bladder  control Trouble passing urine Side effects that usually do not require medical attention (report these to your care team if they continue or are  bothersome): Dry mouth Eyelid drooping Fatigue Headache Pain, redness, or irritation at injection site This list may not describe all possible side effects. Call your doctor for medical advice about side effects. You may report side effects to FDA at 1-800-FDA-1088. Where should I keep my medication? This medication is given in a hospital or clinic and will not be stored at home. NOTE: This sheet is a summary. It may not cover all possible information. If you have questions about this medicine, talk to your doctor, pharmacist, or health care provider.  2024 Elsevier/Gold Standard (2021-05-06 00:00:00)

## 2024-03-27 NOTE — Progress Notes (Signed)
 Provider:  Dedra Gores, MD  Primary Care Physician:     Referring Provider: Dr Ines, [facility]    /      Chief Complaint according to patient   Patient presents with:                HISTORY OF PRESENT ILLNESS:  Veronica Henderson is a 58 y.o. female patient who is here for revisit 03/27/2024 for  Migraine follow - up.  The patient has tried and failed Monotherapy-  Quilipta  every day .  She had trouble getting the medication covered and finally, when she got it, the combination with BOTOX  was the most effective, 2 migraines in a month- the best yet. Now [employer] denied Botox .  Quilipta preventive, ordered botox  through Greig Forbes, NP in 2024 by Dr Ines. Sumatriptan . Doesn't want IV , doesn't want self administered injections.    Chief concern according to patient :    I need the combination of Botox  and Quilipta , and  now that it was denied I am back to Sumatriptan  9 times last months.    Fam Hx : see previous note  Social HX; see previous note  , lives with wife now in [place]. Needs closer to home care.      Review of Systems: Out of a complete 14 system review, the patient complains of only the following symptoms, and all other reviewed systems are negative.:   Migraine 9/months after botox  denial by [employer].      Social History   Socioeconomic History   Marital status: Married    Spouse name: Not on file   Number of children: Not on file   Years of education: School,  short hand ,  court reporter    Highest education level: High school graduate  Occupational History   Not on file  Tobacco Use   Smoking status: Never   Smokeless tobacco: Never  Vaping Use   Vaping status: Never Used  Substance and Sexual Activity   Alcohol use: Not Currently   Drug use: No   Sexual activity: Not on file  Other Topics Concern   Not on file  Social History Narrative   Married wife Veronica Henderson; [place] from [place] in 2018 ; Never smoker; No guns, wears seat  belt, safe in relationship; Self-employed- court steno-grapher; no alcohol. No smoking.    Social Drivers of Corporate Investment Banker Strain: Not on file  Food Insecurity: Not on file  Transportation Needs: Not on file  Physical Activity: Not on file  Stress: Not on file  Social Connections: Not on file    Family History  Problem Relation Age of Onset   Heart disease Mother    Heart disease Father        mi   Hyperlipidemia Father    Hypertension Father    AAA (abdominal aortic aneurysm) Father        ruptured died 29   Breast cancer Sister 48   Migraines Sister    Cancer Sister        cervical age 34 y.o    Ankylosing spondylitis Sister    Stroke Maternal Grandmother    Headache Maternal Grandmother    Migraines Maternal Grandmother    Other Nephew        GIST tumor     Past Medical History:  Diagnosis Date   B12 deficiency    COVID-19    02/26/20  fatigue x 5 days lost taste and smell    Leukopenia    Migraines    since childhood   Seborrheic keratosis     Past Surgical History:  Procedure Laterality Date   BREAST BIOPSY Right 2014   stereo- neg   BREAST BIOPSY Right 07/26/2018   distortion bx with affirm, x marker, Complex sclerosing lesion, Stromal Fibrosis and Sclerosing Adenosis   COLONOSCOPY WITH PROPOFOL  N/A 08/01/2018   Procedure: COLONOSCOPY WITH PROPOFOL ;  Surgeon: Janalyn Keene NOVAK, MD;  Location: [facility] ENDOSCOPY;  Service: Endoscopy;  Laterality: N/A;   NO PAST SURGERIES       Current Outpatient Medications on File Prior to Visit  Medication Sig Dispense Refill   Atogepant  (QULIPTA ) 60 MG TABS Take 1 tablet (60 mg total) by mouth daily. 16 tablet    Cyanocobalamin  (B-12 PO) Take by mouth.     SUMAtriptan  (IMITREX ) 100 MG tablet Take 0.5-1 tablets (50-100 mg total) by mouth once as needed for up to 1 dose. May repeat in 2 hours if headache persists or recurs. Do not exceed 200 mg in 24 hours. Do not exceed 10 doses per month. 10 tablet 0    Atogepant  (QULIPTA ) 60 MG TABS Take 1 tablet (60 mg total) by mouth daily. 90 tablet 0   botulinum toxin Type A  (BOTOX ) 200 units injection Provider to inject 155 units into the muscles of the head and neck every 12 weeks. Discard remainder. 1 each 3   No current facility-administered medications on file prior to visit.    No Known Allergies   DIAGNOSTIC DATA (LABS, IMAGING, TESTING) - I reviewed patient records, labs, notes, testing and imaging myself where available.  Lab Results  Component Value Date   WBC 4.6 04/27/2021   HGB 14.3 04/27/2021   HCT 42.4 04/27/2021   MCV 97.5 04/27/2021   PLT 302 04/27/2021      Component Value Date/Time   NA 137 04/27/2021 1514   K 4.2 04/27/2021 1514   CL 98 04/27/2021 1514   CO2 28 04/27/2021 1514   GLUCOSE 88 04/27/2021 1514   BUN 12 04/27/2021 1514   CREATININE 0.61 04/27/2021 1514   CALCIUM 9.4 04/27/2021 1514   PROT 7.8 04/27/2021 1514   ALBUMIN 4.5 04/27/2021 1514   AST 28 04/27/2021 1514   ALT 20 04/27/2021 1514   ALKPHOS 70 04/27/2021 1514   BILITOT 0.5 04/27/2021 1514   GFRNONAA >60 04/27/2021 1514   Lab Results  Component Value Date   CHOL 184 08/20/2020   HDL 64.80 08/20/2020   LDLCALC 106 (H) 08/20/2020   TRIG 66.0 08/20/2020   CHOLHDL 3 08/20/2020   No results found for: HGBA1C Lab Results  Component Value Date   VITAMINB12 494 10/02/2020   Lab Results  Component Value Date   TSH 4.14 10/02/2020    PHYSICAL EXAM:  Vitals:   03/27/24 0827  BP: 114/79  Pulse: 70   No data found. Body mass index is 22.63 kg/m.   Wt Readings from Last 3 Encounters:  03/27/24 136 lb (61.7 kg)  01/31/23 141 lb (64 kg)  11/30/21 135 lb (61.2 kg)     Ht Readings from Last 3 Encounters:  03/27/24 5' 5 (1.651 m)  01/31/23 5' 5 (1.651 m)  11/30/21 5' 5 (1.651 m)      General: The patient is awake, alert and appears not in acute distress and groomed. Head: Normocephalic, atraumatic.  Neck is supple.  Cardiovascular:  Regular rate and cardiac rhythm  by pulse, without distended neck veins. Respiratory: no shortness of breath  Skin:  Without evidence of ankle edema, or rash.    NEUROLOGIC EXAM: The patient is awake and alert, oriented to place and time.   Memory subjective described as intact.  Attention span & concentration ability appears normal.   Speech is fluent,  without  dysarthria, dysphonia or aphasia.  Mood and affect are appropriate.   Neurological Examination: Mental Status: Intact. Language and speech are normal. No cognitive deficits. Cranial Nerves II-XII: Intact. PERL.  No nystagmus.  No facial droop.  No ptosis.  Hearing is grossly intact bilaterally.  The tongue is normal and midline. Motor: Strengths are 5/5 throughout. Muscle bulk and tone are normal. No tremors.  Coordination: No ataxia or dysmetria.  Sensory: Grossly intact throughout to all modalities. Reflexes: Normal and symmetric throughout. No ankle clonus. Babinski's sign is absent bilaterally. Hoffman's sign is absent bilaterally. Gait and Station: Normal. Romberg's sign is absent.   ASSESSMENT AND PLAN :   58 y.o. year old female  here with:    1)  She had very frequent Migraine, without  Aura but prodromi- anxious feeling, sense of doom,.   2)  she had  migraines with Status migrainosus.   3)  She had great success with Qulipta  and Botox , reducing the need for Imitrex  to 2 a month, and then BOTOX  was denied She now has 9 Migraines a month again.    4) NEEDING BOTOX  ,  appeal denial by [employer].   Will TOC to [facility] in [place], [place] [employer].       I would like to thank Dr Ines for allowing me to meet with this pleasant patient.   Sleep Clinic Patients Dr Ines / Greig Forbes, NP , are generally offered input on sleep hygiene, life style changes and how to improve compliance with medical treatment where applicable. Review and reiteration of good sleep hygiene measures is offered  to any sleep clinic patient, be it in the first consultation or with any follow up visits.   The referring provider will be notified of the test results.   The patient's condition requires frequent monitoring and adjustments in the treatment plan, reflecting the ongoing complexity of care.  This provider is the continuing focal point for all needed services for this condition.  After spending a total time of  35  minutes face to face and time for  history taking, physical and neurologic examination, review of laboratory studies,  personal review of imaging studies, reports and results of other testing and review of referral information / records as far as provided in visit,   Electronically signed by: Dedra Gores, MD 03/27/2024 8:36 AM  [facility] Board certified by The [employer] of Sleep Medicine and Diplomate of the [employer] of Sleep Medicine. Board certified In Neurology through the ABPN, Fellow of the [employer] of Neu[employer].

## 2024-03-28 ENCOUNTER — Telehealth: Payer: Self-pay | Admitting: Neurology

## 2024-03-28 NOTE — Telephone Encounter (Signed)
 Referral for neurology fax to [facility] [address] Phone: [phone], Fax: [phone]

## 2024-04-29 ENCOUNTER — Telehealth: Payer: Self-pay

## 2024-04-29 ENCOUNTER — Other Ambulatory Visit (HOSPITAL_COMMUNITY): Payer: Self-pay

## 2024-04-29 NOTE — Telephone Encounter (Signed)
 Pharmacy Patient Advocate Encounter  Received notification from [facility] that Prior Authorization for Qulipta  has been APPROVED from 04/29/2024 to 04/29/2025   PA #/Case ID/Reference #: [id number]

## 2024-04-29 NOTE — Telephone Encounter (Signed)
 Pharmacy Patient Advocate Encounter   Received notification from CoverMyMeds that prior authorization for Qulipta  60mg  Tablets is required/requested.   Insurance verification completed.   The patient is insured through [facility].   Per test claim: PA required; PA started via CoverMyMeds. KEY [id number] . Waiting for clinical questions to populate.
